# Patient Record
Sex: Male | Born: 1995
Health system: Southern US, Community
[De-identification: ages and names within clinical notes are randomized; demographics above are authoritative.]

## PROBLEM LIST (undated history)

## (undated) DIAGNOSIS — G4730 Sleep apnea, unspecified: Secondary | ICD-10-CM

## (undated) DIAGNOSIS — R7303 Prediabetes: Secondary | ICD-10-CM

## (undated) DIAGNOSIS — E669 Obesity, unspecified: Secondary | ICD-10-CM

## (undated) DIAGNOSIS — J45909 Unspecified asthma, uncomplicated: Secondary | ICD-10-CM

## (undated) DIAGNOSIS — B59 Pneumocystosis: Secondary | ICD-10-CM

## (undated) DIAGNOSIS — B20 Human immunodeficiency virus [HIV] disease: Secondary | ICD-10-CM

## (undated) HISTORY — PX: APPENDECTOMY: SHX54

## (undated) HISTORY — PX: ADENOIDECTOMY: SUR15

## (undated) HISTORY — PX: TONSILLECTOMY: SUR1361

## (undated) HISTORY — DX: Prediabetes: R73.03

## (undated) HISTORY — DX: Pneumocystosis: B59

## (undated) HISTORY — DX: Human immunodeficiency virus (HIV) disease: B20

## (undated) HISTORY — PX: TYMPANOPLASTY: SHX33

## (undated) HISTORY — DX: Sleep apnea, unspecified: G47.30

## (undated) HISTORY — DX: Obesity, unspecified: E66.9

---
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
## 2015-07-03 ENCOUNTER — Emergency Department (HOSPITAL_COMMUNITY)
Admission: EM | Admit: 2015-07-03 | Discharge: 2015-07-03 | Disposition: A | Discharge status: Home / Self Care | Payer: 59 | Attending: Emergency Medicine | Admitting: Emergency Medicine

## 2015-07-03 ENCOUNTER — Encounter (HOSPITAL_COMMUNITY): Payer: Self-pay | Admitting: Emergency Medicine

## 2015-07-03 DIAGNOSIS — J45909 Unspecified asthma, uncomplicated: Secondary | ICD-10-CM | POA: Diagnosis not present

## 2015-07-03 DIAGNOSIS — K610 Anal abscess: Secondary | ICD-10-CM | POA: Diagnosis present

## 2015-07-03 DIAGNOSIS — Z79899 Other long term (current) drug therapy: Secondary | ICD-10-CM | POA: Diagnosis not present

## 2015-07-03 MED ORDER — LIDOCAINE-EPINEPHRINE (PF) 2 %-1:200000 IJ SOLN
20.0000 mL | Freq: Once | INTRAMUSCULAR | Status: AC
  Administered 2015-07-03: 20 mL
  Filled 2015-07-03: qty 20

## 2015-07-03 MED ORDER — SULFAMETHOXAZOLE-TRIMETHOPRIM 800-160 MG PO TABS: 1.0000 | ORAL_TABLET | Freq: Two times a day (BID) | ORAL | Status: AC

## 2015-07-03 NOTE — ED Provider Notes (Signed)
CSN: [id number]     Arrival date & time 07/03/15  0825 History   First MD Initiated Contact with Patient 07/03/15 0830     Chief Complaint  Patient presents with  . Abscess     HPI Patient presents to the emergency department complaining of swelling near his rectum.  He does have intercourse with men.  He's never had an abscess before.  He has had some drainage.  No fevers or chills.  He reports it hurts to have bowel movement and hurts to sit.  No other complaints.  His pain is moderate in severity   Past Medical History  Diagnosis Date  . Asthma    Past Surgical History  Procedure Laterality Date  . Tonsillectomy    . Appendectomy    . Tympanoplasty     Family History  Problem Relation Age of Onset  . Cancer Neg Hx   . Diabetes Neg Hx   . Heart failure Neg Hx    Social History  Substance Use Topics  . Smoking status: Never Smoker   . Smokeless tobacco: None  . Alcohol Use: No    Review of Systems  All other systems reviewed and are negative.     Allergies  Review of patient's allergies indicates no known allergies.  Home Medications   Prior to Admission medications   Medication Sig Start Date End Date Taking? Authorizing Provider  albuterol (PROVENTIL HFA;VENTOLIN HFA) 108 (90 BASE) MCG/ACT inhaler Inhale 2 puffs into the lungs every 4 (four) hours as needed for wheezing or shortness of breath. For shortness of breath   Yes Historical Provider, MD  bismuth subsalicylate (PEPTO BISMOL) 262 MG/15ML suspension Take 30 mLs by mouth every 6 (six) hours as needed. For upset stomach   Yes Historical Provider, MD  ibuprofen (ADVIL,MOTRIN) 200 MG tablet Take 400 mg by mouth every 6 (six) hours as needed for moderate pain.   Yes Historical Provider, MD  montelukast (SINGULAIR) 10 MG tablet Take 10 mg by mouth daily.   Yes Historical Provider, MD  sulfamethoxazole-trimethoprim (BACTRIM DS,SEPTRA DS) 800-160 MG tablet Take 1 tablet by mouth 2 (two) times daily. 07/03/15  07/10/15  Azalia Bilis, MD   BP 141/76 mmHg  Pulse 85  Temp(Src) 99 F (37.2 C) (Oral)  Resp 16  SpO2 95% Physical Exam  Constitutional: He is oriented to person, place, and time. He appears well-developed and well-nourished.  HENT:  Head: Normocephalic.  Eyes: EOM are normal.  Neck: Normal range of motion.  Pulmonary/Chest: Effort normal.  Abdominal: He exhibits no distension.  Genitourinary:  Perianal abscess with drainage.  Musculoskeletal: Normal range of motion.  Neurological: He is alert and oriented to person, place, and time.  Psychiatric: He has a normal mood and affect.  Nursing note and vitals reviewed.   ED Course  Procedures (including critical care time)  INCISION AND DRAINAGE Performed by: Lyanne Co Consent: Verbal consent obtained. Risks and benefits: risks, benefits and alternatives were discussed Time out performed prior to procedure Type: abscess Body area: Perianal Anesthesia: local infiltration Incision was made with a scalpel. Local anesthetic: lidocaine 2 % with epinephrine Anesthetic total: 4 ml Complexity: complex Blunt dissection to break up loculations Drainage: purulent Drainage amount: Moderate  Packing material: None  Patient tolerance: Patient tolerated the procedure well with no immediate complications.     Labs Review Labs Reviewed - No data to display  Imaging Review No results found. I have personally reviewed and evaluated these images and lab results  as part of my medical decision-making.   EKG Interpretation None      MDM   Final diagnoses:  Perianal abscess    Perianal abscess.  Drained at the bedside.  Moderate amount of pus obtained.  Patient feels much better.  Recommend antibiotics and warm water soaks.  He understands to return to the ER for new or worsening symptoms.     Azalia BilisKevin , MD 07/03/15 1011

## 2015-07-03 NOTE — Discharge Instructions (Signed)
Perianal Abscess An abscess is an infected area that contains a collection of pus and debris. A perianal abscess is one that occurs in the perineal area, which is the area between the anus and the scrotum in males and between the anus and the vagina in females. Perianal abscesses can vary in size. Without treatment, a perianal abscess can become larger and cause other problems. CAUSES  Glands in the perineal area can become plugged up with debris. When this happens, an abscess may form.  SIGNS AND SYMPTOMS  The most common symptoms of a perianal abscess are:  Swelling and redness in the area of the abscess. The redness may go beyond the abscess and appear as a red streak on the skin.  Pain in the area of the abscess. Other possible symptoms include:   A visible lump or a lump that can be felt when touching the area and is usually painful.  Bleeding or pus-like discharge from the area.  Fever.  General weakness. DIAGNOSIS  Your health care provider will take a medical history and examine the area. This may involve examining the rectal area with a gloved hand (digital rectal exam). For women, it may require a careful vaginal exam. Sometimes, the health care provider needs to look into the rectum using a probe or scope. TREATMENT  Treatment often requires making a cut (incision) in the abscess to drain the pus. This can sometimes be done in your health care provider's office or an emergency department after giving you medicine to numb the area (local anesthetic). For larger or deeper abscesses, surgery may be required to drain the abscess. Antibiotic medicines are sometimes given if there is infection of the surrounding tissue (cellulitis). In some cases, gauze is packed into the abscess to continue draining the area. Frequent sitz baths may be recommended to help the wound heal and to reduce the chance of the abscess coming back. HOME CARE INSTRUCTIONS   Only take over-the-counter or  prescription medicines for pain, fever, or discomfort as directed by your health care provider.  Take antibiotic medicine as directed. Make sure you finish it even if you start to feel better.  If gauze is used in the abscess, follow your health care provider's instructions for removing or changing the gauze. It can usually be removed in 2-3 days.  If one or more drains have been placed in the abscess cavity, be careful not to pull at them. Your health care provider will tell you how long they need to remain in place.  Take warm sitz baths 3-4 times a day and after bowel movements. This will help reduce pain and swelling.  Keep the skin around the abscess clean and dry. Avoid cleaning the area too much.  Avoid scratching the abscess area.  Avoid using colored or perfumed toilet papers. SEEK MEDICAL CARE IF:   You have trouble having a bowel movement or passing urine.  Your pain or swelling in the affected area does not seem to be improving.  The gauze packing or the drains come out before the planned time. SEEK IMMEDIATE MEDICAL CARE IF:   You have problems moving or using your legs.  You have severe or increasing pain.  Your swelling in the affected area suddenly gets worse.  You have a large increase in bleeding or passing of pus.  You have chills or a fever. MAKE SURE YOU:   Understand these instructions.  Will watch your condition.  Will get help right away if you are   not doing well or get worse.   This information is not intended to replace advice given to you by your health care provider. Make sure you discuss any questions you have with your health care provider.   Document Released: 09/03/2006 Document Revised: 05/18/2013 Document Reviewed: 03/09/2013 Elsevier Interactive Patient Education 2016 [employer]  

## 2015-07-03 NOTE — ED Notes (Signed)
Per patient states he noticed bump on right buttock about a week ago-has gotten worse-some drainage, increased pain

## 2015-12-02 ENCOUNTER — Emergency Department (HOSPITAL_COMMUNITY)
Admission: EM | Admit: 2015-12-02 | Discharge: 2015-12-02 | Disposition: A | Discharge status: Home / Self Care | Payer: 59 | Attending: Emergency Medicine | Admitting: Emergency Medicine

## 2015-12-02 ENCOUNTER — Emergency Department (HOSPITAL_COMMUNITY): Payer: 59

## 2015-12-02 DIAGNOSIS — Z7951 Long term (current) use of inhaled steroids: Secondary | ICD-10-CM | POA: Diagnosis not present

## 2015-12-02 DIAGNOSIS — J45909 Unspecified asthma, uncomplicated: Secondary | ICD-10-CM | POA: Insufficient documentation

## 2015-12-02 DIAGNOSIS — Z79899 Other long term (current) drug therapy: Secondary | ICD-10-CM | POA: Diagnosis not present

## 2015-12-02 DIAGNOSIS — Z791 Long term (current) use of non-steroidal anti-inflammatories (NSAID): Secondary | ICD-10-CM | POA: Insufficient documentation

## 2015-12-02 DIAGNOSIS — R079 Chest pain, unspecified: Secondary | ICD-10-CM | POA: Diagnosis not present

## 2015-12-02 DIAGNOSIS — M549 Dorsalgia, unspecified: Secondary | ICD-10-CM | POA: Diagnosis not present

## 2015-12-02 DIAGNOSIS — Y92411 Interstate highway as the place of occurrence of the external cause: Secondary | ICD-10-CM | POA: Diagnosis not present

## 2015-12-02 DIAGNOSIS — Y999 Unspecified external cause status: Secondary | ICD-10-CM | POA: Insufficient documentation

## 2015-12-02 DIAGNOSIS — Y939 Activity, unspecified: Secondary | ICD-10-CM | POA: Insufficient documentation

## 2015-12-02 MED ORDER — METHOCARBAMOL 500 MG PO TABS: 500.0000 mg | ORAL_TABLET | Freq: Two times a day (BID) | ORAL | Status: DC

## 2015-12-02 MED ORDER — IBUPROFEN 800 MG PO TABS: 800.0000 mg | ORAL_TABLET | Freq: Three times a day (TID) | ORAL | Status: DC
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
## 2015-12-02 NOTE — Discharge Instructions (Signed)

## 2015-12-02 NOTE — ED Notes (Signed)
PT DISCHARGED. INSTRUCTIONS AND PRESCRIPTIONS GIVEN. AAOX3. PT IN NO APPARENT DISTRESS. THE OPPORTUNITY TO ASK QUESTIONS WAS PROVIDED. 
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
## 2015-12-04 ENCOUNTER — Encounter (HOSPITAL_BASED_OUTPATIENT_CLINIC_OR_DEPARTMENT_OTHER): Payer: Self-pay | Admitting: Emergency Medicine
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
## 2019-03-21 DIAGNOSIS — U071 COVID-19: Secondary | ICD-10-CM | POA: Diagnosis not present

## 2019-03-21 DIAGNOSIS — Z03818 Encounter for observation for suspected exposure to other biological agents ruled out: Secondary | ICD-10-CM | POA: Diagnosis not present

## 2019-10-31 ENCOUNTER — Ambulatory Visit: Payer: Self-pay | Admitting: Family Medicine

## 2020-01-10 ENCOUNTER — Ambulatory Visit (HOSPITAL_COMMUNITY)
Admission: EM | Admit: 2020-01-10 | Discharge: 2020-01-10 | Disposition: A | Discharge status: Home / Self Care | Payer: BC Managed Care – PPO | Attending: Family Medicine | Admitting: Family Medicine

## 2020-01-10 ENCOUNTER — Encounter (HOSPITAL_COMMUNITY): Payer: Self-pay

## 2020-01-10 ENCOUNTER — Other Ambulatory Visit: Payer: Self-pay

## 2020-01-10 DIAGNOSIS — K5900 Constipation, unspecified: Secondary | ICD-10-CM | POA: Diagnosis not present

## 2020-01-10 DIAGNOSIS — Z20822 Contact with and (suspected) exposure to covid-19: Secondary | ICD-10-CM | POA: Diagnosis not present

## 2020-01-10 DIAGNOSIS — R1030 Lower abdominal pain, unspecified: Secondary | ICD-10-CM

## 2020-01-10 LAB — LIPASE, BLOOD: Lipase: 16 U/L (ref 11–51)

## 2020-01-10 LAB — CBC WITH DIFFERENTIAL/PLATELET
Abs Immature Granulocytes: 0.04 10*3/uL (ref 0.00–0.07)
Basophils Absolute: 0 10*3/uL (ref 0.0–0.1)
Basophils Relative: 0 %
Eosinophils Absolute: 0 10*3/uL (ref 0.0–0.5)
Eosinophils Relative: 0 %
HCT: 45.5 % (ref 39.0–52.0)
Hemoglobin: 15.6 g/dL (ref 13.0–17.0)
Immature Granulocytes: 1 %
Lymphocytes Relative: 25 %
Lymphs Abs: 2.1 10*3/uL (ref 0.7–4.0)
MCH: 30.6 pg (ref 26.0–34.0)
MCHC: 34.3 g/dL (ref 30.0–36.0)
MCV: 89.2 fL (ref 80.0–100.0)
Monocytes Absolute: 1 10*3/uL (ref 0.1–1.0)
Monocytes Relative: 12 %
Neutro Abs: 5.2 10*3/uL (ref 1.7–7.7)
Neutrophils Relative %: 62 %
Platelets: 301 10*3/uL (ref 150–400)
RBC: 5.1 MIL/uL (ref 4.22–5.81)
RDW: 11.4 % — ABNORMAL LOW (ref 11.5–15.5)
WBC: 8.3 10*3/uL (ref 4.0–10.5)
nRBC: 0 % (ref 0.0–0.2)

## 2020-01-10 LAB — COMPREHENSIVE METABOLIC PANEL
ALT: 23 U/L (ref 0–44)
AST: 22 U/L (ref 15–41)
Albumin: 3.9 g/dL (ref 3.5–5.0)
Alkaline Phosphatase: 61 U/L (ref 38–126)
Anion gap: 14 (ref 5–15)
BUN: 7 mg/dL (ref 6–20)
CO2: 26 mmol/L (ref 22–32)
Calcium: 9.3 mg/dL (ref 8.9–10.3)
Chloride: 100 mmol/L (ref 98–111)
Creatinine, Ser: 0.75 mg/dL (ref 0.61–1.24)
GFR calc Af Amer: >60 mL/min (ref >60)
GFR calc non Af Amer: >60 mL/min (ref >60)
Glucose, Bld: 114 mg/dL — ABNORMAL HIGH (ref 70–99)
Potassium: 3.8 mmol/L (ref 3.5–5.1)
Sodium: 140 mmol/L (ref 135–145)
Total Bilirubin: 0.5 mg/dL (ref 0.3–1.2)
Total Protein: 7.9 g/dL (ref 6.5–8.1)

## 2020-01-10 NOTE — ED Triage Notes (Signed)
Pt c/o lower abdom pain (LLQ more than RLQ) since last Wednesday that pt describes as pressure and feeling constipated. Pt states he had video visit for same c/o and used laxative (Miralax), Fleets supp and ducolax pills without any bowel movement.  Also c/o HA and mild cough for past two days. States feels a "release of pressure" from lower abdomen when he urinates and urinary frequency, urgency. Also reports lower back pain and lack of appetite for 5 days.  Denies vomiting, but feels his stomach is "uneasy". Denies fever, sore throat, congestion, burning on urination.   Denies tylenol/ibuprofen.

## 2020-01-10 NOTE — Discharge Instructions (Signed)
You have been seen today for abdominal pain. Your evaluation was not suggestive of any emergent condition requiring medical intervention at this time. However, some abdominal problems make take more time to appear. Therefore, it is very important for you to pay attention to any new symptoms or worsening of your current condition.  Please return here or to the Emergency Department immediately should you begin to feel worse in any way or have any of the following symptoms: increasing or different abdominal pain, persistent vomiting, inability to drink fluids, fevers, or shaking chills.   You may try over the counter magnesium citrate to help with constipation.

## 2020-01-10 NOTE — ED Notes (Signed)
POCT Urinalysis Dipstick results did not cross over into Epic System.  Paper copy given to Dr. Tracie Harrier by B. Bing Plume, EMT

## 2020-01-11 LAB — SARS CORONAVIRUS 2 (TAT 6-24 HRS): SARS Coronavirus 2: NEGATIVE

## 2020-01-11 LAB — POCT URINALYSIS DIP (DEVICE)
Glucose, UA: NEGATIVE mg/dL
Hgb urine dipstick: NEGATIVE
Ketones, ur: 15 mg/dL — AB
Leukocytes,Ua: NEGATIVE
Nitrite: NEGATIVE
Protein, ur: 100 mg/dL — AB
Specific Gravity, Urine: >=1.03 (ref 1.005–1.030)
Urobilinogen, UA: 2 mg/dL — ABNORMAL HIGH (ref 0.0–1.0)
pH: 6 (ref 5.0–8.0)

## 2020-01-11 NOTE — ED Provider Notes (Signed)
[facility]   [id number] 01/10/20 Arrival Time: 1541  ASSESSMENT & PLAN:  1. Lower abdominal pain   2. Constipation, unspecified constipation type     Benign abdominal exam. No indications for urgent abdominal/pelvic imaging at this time. Discussed. COVID testing sent. See AVS for d/c instructions.  Results for orders placed or performed during the hospital encounter of 01/10/20  CBC with Differential  Result Value Ref Range   WBC 8.3 4.0 - 10.5 K/uL   RBC 5.10 4.22 - 5.81 MIL/uL   Hemoglobin 15.6 13.0 - 17.0 g/dL   HCT 39.0 30.0 - 92.3 %   MCV 89.2 80.0 - 100.0 fL   MCH 30.6 26.0 - 34.0 pg   MCHC 34.3 30.0 - 36.0 g/dL   RDW 30.0 (L) 76.2 - 26.3 %   Platelets 301 150 - 400 K/uL   nRBC 0.0 0.0 - 0.2 %   Neutrophils Relative % 62 %   Neutro Abs 5.2 1.7 - 7.7 K/uL   Lymphocytes Relative 25 %   Lymphs Abs 2.1 0.7 - 4.0 K/uL   Monocytes Relative 12 %   Monocytes Absolute 1.0 0.1 - 1.0 K/uL   Eosinophils Relative 0 %   Eosinophils Absolute 0.0 0.0 - 0.5 K/uL   Basophils Relative 0 %   Basophils Absolute 0.0 0.0 - 0.1 K/uL   Immature Granulocytes 1 %   Abs Immature Granulocytes 0.04 0.00 - 0.07 K/uL  Comprehensive metabolic panel  Result Value Ref Range   Sodium 140 135 - 145 mmol/L   Potassium 3.8 3.5 - 5.1 mmol/L   Chloride 100 98 - 111 mmol/L   CO2 26 22 - 32 mmol/L   Glucose, Bld 114 (H) 70 - 99 mg/dL   BUN 7 6 - 20 mg/dL   Creatinine, Ser 3.35 0.61 - 1.24 mg/dL   Calcium 9.3 8.9 - 45.6 mg/dL   Total Protein 7.9 6.5 - 8.1 g/dL   Albumin 3.9 3.5 - 5.0 g/dL   AST 22 15 - 41 U/L   ALT 23 0 - 44 U/L   Alkaline Phosphatase 61 38 - 126 U/L   Total Bilirubin 0.5 0.3 - 1.2 mg/dL   GFR calc non Af Amer >60 >60 mL/min   GFR calc Af Amer >60 >60 mL/min   Anion gap 14 5 - 15  Lipase, blood  Result Value Ref Range   Lipase 16 11 - 51 U/L       Discharge Instructions     You have been seen today for abdominal pain. Your evaluation was not suggestive of any  emergent condition requiring medical intervention at this time. However, some abdominal problems make take more time to appear. Therefore, it is very important for you to pay attention to any new symptoms or worsening of your current condition.  Please return here or to the Emergency Department immediately should you begin to feel worse in any way or have any of the following symptoms: increasing or different abdominal pain, persistent vomiting, inability to drink fluids, fevers, or shaking chills.   You may try over the counter magnesium citrate to help with constipation.     Reviewed expectations re: course of current medical issues. Questions answered. Outlined signs and symptoms indicating need for more acute intervention. Patient verbalized understanding. After Visit Summary given.   SUBJECTIVE: History from: patient. Marvin Miller is a 24 y.o. male who presents with complaint of intermittent lower abdominal discomfort. Onset gradual, first noted within the past week. Discomfort  described as a "full feeling"; without radiation; does not wake him at night. Reports normal flatus. Symptoms are unchanged since beginning. Fever: absent. Aggravating factors: have not been identified. Alleviating factors: passing gas. Associated symptoms: none reported. He denies arthralgias, dysuria, headache, myalgias and sweats. Appetite: normal. PO intake: normal. Ambulatory without assistance. Urinary symptoms: none. Bowel movements: are less frequent than before; last bowel movement 2-3 d ago and without blood; usually has BM x2 daily. History of similar: yes; dx with constipation. OTC treatment: Miralax without relief.   Past Surgical History:  Procedure Laterality Date  . APPENDECTOMY    . TONSILLECTOMY    . TYMPANOPLASTY       OBJECTIVE:  Vitals:   01/10/20 1647  BP: (!) 142/80  Pulse: 93  Resp: 20  Temp: 99.1 F (37.3 C)  TempSrc: Oral  SpO2: 98%    General appearance: alert,  oriented, no acute distress HEENT: [place]; AT; oropharynx moist Lungs: unlabored respirations Abdomen: soft; without distention; mild  and poorly localized tenderness to palpation over lower abdomen; normal bowel sounds; without masses or organomegaly; without guarding or rebound tenderness Back: without reported CVA tenderness; FROM at waist Extremities: without LE edema; symmetrical; without gross deformities Skin: warm and dry Neurologic: normal gait Psychological: alert and cooperative; normal mood and affect    No Known Allergies                                             Past Medical History:  Diagnosis Date  . Asthma     Social History   Socioeconomic History  . Marital status: Single    Spouse name: Not on file  . Number of children: Not on file  . Years of education: Not on file  . Highest education level: Not on file  Occupational History  . Not on file  Tobacco Use  . Smoking status: Never Smoker  . Smokeless tobacco: Never Used  Substance and Sexual Activity  . Alcohol use: No  . Drug use: No  . Sexual activity: Not on file  Other Topics Concern  . Not on file  Social History Narrative  . Not on file   Social Determinants of Health   Financial Resource Strain:   . Difficulty of Paying Living Expenses:   Food Insecurity:   . Worried About Programme researcher, broadcasting/film/video in the Last Year:   . Barista in the Last Year:   Transportation Needs:   . Freight forwarder (Medical):   Marland Kitchen Lack of Transportation (Non-Medical):   Physical Activity:   . Days of Exercise per Week:   . Minutes of Exercise per Session:   Stress:   . Feeling of Stress :   Social Connections:   . Frequency of Communication with Friends and Family:   . Frequency of Social Gatherings with Friends and Family:   . Attends Religious Services:   . Active Member of Clubs or Organizations:   . Attends Banker Meetings:   Marland Kitchen Marital Status:   Intimate Partner Violence:   . Fear  of Current or Ex-Partner:   . Emotionally Abused:   Marland Kitchen Physically Abused:   . Sexually Abused:     Family History  Problem Relation Age of Onset  . Hypertension Mother   . Asthma Mother   . Heart failure Father   . Diabetes  Father   . Cancer Neg Hx     Vanessa Kick, MD 01/11/20 1003

## 2020-01-24 DIAGNOSIS — Z1331 Encounter for screening for depression: Secondary | ICD-10-CM | POA: Diagnosis not present

## 2020-01-24 DIAGNOSIS — R030 Elevated blood-pressure reading, without diagnosis of hypertension: Secondary | ICD-10-CM | POA: Diagnosis not present

## 2020-01-24 DIAGNOSIS — R739 Hyperglycemia, unspecified: Secondary | ICD-10-CM | POA: Diagnosis not present

## 2020-01-24 DIAGNOSIS — R21 Rash and other nonspecific skin eruption: Secondary | ICD-10-CM | POA: Diagnosis not present

## 2020-01-24 DIAGNOSIS — G4452 New daily persistent headache (NDPH): Secondary | ICD-10-CM | POA: Diagnosis not present

## 2020-02-07 DIAGNOSIS — Z114 Encounter for screening for human immunodeficiency virus [HIV]: Secondary | ICD-10-CM | POA: Diagnosis not present

## 2020-02-07 DIAGNOSIS — Z113 Encounter for screening for infections with a predominantly sexual mode of transmission: Secondary | ICD-10-CM | POA: Diagnosis not present

## 2020-02-07 DIAGNOSIS — A5149 Other secondary syphilitic conditions: Secondary | ICD-10-CM | POA: Diagnosis not present

## 2023-01-17 ENCOUNTER — Ambulatory Visit (HOSPITAL_COMMUNITY): Payer: Self-pay

## 2023-01-19 ENCOUNTER — Ambulatory Visit (HOSPITAL_COMMUNITY)
Admission: RE | Admit: 2023-01-19 | Discharge: 2023-01-19 | Disposition: A | Discharge status: Home / Self Care | Payer: No Typology Code available for payment source | Source: Ambulatory Visit | Attending: Family Medicine | Admitting: Family Medicine

## 2023-01-19 ENCOUNTER — Other Ambulatory Visit: Payer: Self-pay

## 2023-01-19 ENCOUNTER — Ambulatory Visit (INDEPENDENT_AMBULATORY_CARE_PROVIDER_SITE_OTHER): Payer: No Typology Code available for payment source

## 2023-01-19 ENCOUNTER — Encounter (HOSPITAL_COMMUNITY): Payer: Self-pay

## 2023-01-19 VITALS — BP 134/93 | HR 89 | Temp 98.2°F | Resp 19

## 2023-01-19 DIAGNOSIS — K5900 Constipation, unspecified: Secondary | ICD-10-CM | POA: Diagnosis present

## 2023-01-19 LAB — COMPREHENSIVE METABOLIC PANEL
ALT: 16 U/L (ref 0–44)
AST: 18 U/L (ref 15–41)
Albumin: 3.8 g/dL (ref 3.5–5.0)
Alkaline Phosphatase: 31 U/L — ABNORMAL LOW (ref 38–126)
Anion gap: 12 (ref 5–15)
BUN: 5 mg/dL — ABNORMAL LOW (ref 6–20)
CO2: 25 mmol/L (ref 22–32)
Calcium: 8.9 mg/dL (ref 8.9–10.3)
Chloride: 100 mmol/L (ref 98–111)
Creatinine, Ser: 0.7 mg/dL (ref 0.61–1.24)
GFR, Estimated: >60 mL/min (ref >60)
Glucose, Bld: 90 mg/dL (ref 70–99)
Potassium: 3.9 mmol/L (ref 3.5–5.1)
Sodium: 137 mmol/L (ref 135–145)
Total Bilirubin: 0.6 mg/dL (ref 0.3–1.2)
Total Protein: 6.7 g/dL (ref 6.5–8.1)

## 2023-01-19 LAB — CBC
HCT: 40.2 % (ref 39.0–52.0)
Hemoglobin: 14 g/dL (ref 13.0–17.0)
MCH: 30.6 pg (ref 26.0–34.0)
MCHC: 34.8 g/dL (ref 30.0–36.0)
MCV: 87.8 fL (ref 80.0–100.0)
Platelets: 203 10*3/uL (ref 150–400)
RBC: 4.58 MIL/uL (ref 4.22–5.81)
RDW: 12.8 % (ref 11.5–15.5)
WBC: 5.5 10*3/uL (ref 4.0–10.5)
nRBC: 0 % (ref 0.0–0.2)

## 2023-01-19 NOTE — ED Triage Notes (Signed)
Pt reports feeling constipated for 2 weeks. Pt reports feeling bloated and pressure at rectum. Pt also had rectal bleeding last night.

## 2023-01-19 NOTE — Discharge Instructions (Addendum)
Your x-ray did show a good bit of stool in your colon.  Get a bottle of magnesium citrate over-the-counter and take it according to package instructions  Also get MiraLAX and start taking it according to bottle instructions, 1 capful in 6 to 8 ounces of liquid once daily.  We have drawn a blood count and a CMP today.  Staff will notify you if anything is significantly abnormal.  If your pain worsens or you are not able to have a bowel movement despite the above medications, and please consider being seen on an urgent basis in the emergency room.  You can use the QR code/website at the back of the summary paperwork to schedule yourself a new patient appointment with primary care

## 2023-01-19 NOTE — ED Provider Notes (Signed)
[facility]    CSN: [id number] Arrival date & time: 01/19/23  1447      History   Chief Complaint Chief Complaint  Patient presents with   Constipation    Entered by patient    HPI Marvin Miller is a 27 y.o. male.    Constipation  Here for constipation has been going on for 2 weeks.  He saw little bit of blood when it first started and then this morning when he had a small amount of bowel movement he had blood pour into the toilet bowl and has had pain in his rectum.  No fever and no nausea or vomiting.  He has maybe had a little congestion in the last couple of weeks with little cough.   He has had constipation in the past, but not to this degree; he has been trying to take a stool softener  Past Medical History:  Diagnosis Date   Asthma     There are no problems to display for this patient.   Past Surgical History:  Procedure Laterality Date   APPENDECTOMY     TONSILLECTOMY     TYMPANOPLASTY         Home Medications    Prior to Admission medications   Not on File    Family History Family History  Problem Relation Age of Onset   Hypertension Mother    Asthma Mother    Heart failure Father    Diabetes Father    Cancer Neg Hx     Social History Social History   Tobacco Use   Smoking status: Never   Smokeless tobacco: Never  Vaping Use   Vaping Use: Never used  Substance Use Topics   Alcohol use: No   Drug use: No     Allergies   Patient has no known allergies.   Review of Systems Review of Systems  Gastrointestinal:  Positive for constipation.     Physical Exam Triage Vital Signs ED Triage Vitals  Enc Vitals Group     BP 01/19/23 1505 (!) 134/93     Pulse Rate 01/19/23 1505 89     Resp 01/19/23 1505 19     Temp 01/19/23 1505 98.2 F (36.8 C)     Temp src --      SpO2 01/19/23 1505 97 %     Weight --      Height --      Head Circumference --      Peak Flow --      Pain Score 01/19/23 1503 8     Pain Loc  --      Pain Edu? --      Excl. in GC? --    No data found.  Updated Vital Signs BP (!) 134/93   Pulse 89   Temp 98.2 F (36.8 C)   Resp 19   SpO2 97%   Visual Acuity Right Eye Distance:   Left Eye Distance:   Bilateral Distance:    Right Eye Near:   Left Eye Near:    Bilateral Near:     Physical Exam Vitals reviewed.  Constitutional:      General: He is not in acute distress.    Appearance: He is not ill-appearing, toxic-appearing or diaphoretic.  HENT:     Mouth/Throat:     Mouth: Mucous membranes are moist.     Pharynx: No oropharyngeal exudate or posterior oropharyngeal erythema.  Eyes:     Extraocular Movements: Extraocular movements  intact.     Conjunctiva/sclera: Conjunctivae normal.     Pupils: Pupils are equal, round, and reactive to light.  Cardiovascular:     Rate and Rhythm: Normal rate and regular rhythm.     Heart sounds: No murmur heard. Pulmonary:     Effort: Pulmonary effort is normal.     Breath sounds: Normal breath sounds.  Abdominal:     General: Bowel sounds are normal. There is no distension.     Palpations: Abdomen is soft. There is no mass.     Tenderness: There is no abdominal tenderness. There is no guarding.  Genitourinary:    Comments: Inspection of the rectum does not show any external hemorrhoids.  He is very tender around his rectum, but I do not palpate any induration or fluctuance or masses.  An attempt is made to do a digital rectal exam, but he was so tender that I could not palpate further than the rectal verge. Musculoskeletal:     Cervical back: Neck supple.  Skin:    Capillary Refill: Capillary refill takes less than 2 seconds.     Coloration: Skin is not jaundiced or pale.  Neurological:     General: No focal deficit present.     Mental Status: He is alert and oriented to person, place, and time.  Psychiatric:        Behavior: Behavior normal.      UC Treatments / Results  Labs (all labs ordered are listed, but  only abnormal results are displayed) Labs Reviewed  COMPREHENSIVE METABOLIC PANEL  CBC    EKG   Radiology No results found.  Procedures Procedures (including critical care time)  Medications Ordered in UC Medications - No data to display  Initial Impression / Assessment and Plan / UC Course  I have reviewed the triage vital signs and the nursing notes.  Pertinent labs & imaging results that were available during my care of the patient were reviewed by me and considered in my medical decision making (see chart for details).        There is a moderate stool burden on the KUB done today.  I am recommending mag citrate and MiraLAX.  If he worsens anyway I am asking him to proceed to the emergency room.  CBC and CMP are drawn today and we will let him know if there is any problems on the lab work.  I do think he most likely has at least a small rectal fissure due to a large caliber stool, considering his history Final Clinical Impressions(s) / UC Diagnoses   Final diagnoses:  Constipation, unspecified constipation type     Discharge Instructions      Your x-ray did show a good bit of stool in your colon.  Get a bottle of magnesium citrate over-the-counter and take it according to package instructions  Also get MiraLAX and start taking it according to bottle instructions, 1 capful in 6 to 8 ounces of liquid once daily.  We have drawn a blood count and a CMP today.  Staff will notify you if anything is significantly abnormal.  If your pain worsens or you are not able to have a bowel movement despite the above medications, and please consider being seen on an urgent basis in the emergency room.  You can use the QR code/website at the back of the summary paperwork to schedule yourself a new patient appointment with primary care      ED Prescriptions   None  I have reviewed the PDMP during this encounter.   Zenia Resides, MD 01/19/23 [phone]

## 2023-03-22 ENCOUNTER — Encounter (HOSPITAL_COMMUNITY): Payer: Self-pay

## 2023-03-22 ENCOUNTER — Emergency Department (HOSPITAL_COMMUNITY): Payer: No Typology Code available for payment source

## 2023-03-22 ENCOUNTER — Other Ambulatory Visit: Payer: Self-pay

## 2023-03-22 ENCOUNTER — Inpatient Hospital Stay (HOSPITAL_COMMUNITY)
Admission: EM | Admit: 2023-03-22 | Discharge: 2023-03-27 | DRG: 975 | Disposition: A | Discharge status: Home / Self Care | Payer: No Typology Code available for payment source | Attending: Internal Medicine | Admitting: Internal Medicine

## 2023-03-22 DIAGNOSIS — E669 Obesity, unspecified: Secondary | ICD-10-CM | POA: Diagnosis present

## 2023-03-22 DIAGNOSIS — Z8249 Family history of ischemic heart disease and other diseases of the circulatory system: Secondary | ICD-10-CM

## 2023-03-22 DIAGNOSIS — Z1152 Encounter for screening for COVID-19: Secondary | ICD-10-CM

## 2023-03-22 DIAGNOSIS — Z87891 Personal history of nicotine dependence: Secondary | ICD-10-CM

## 2023-03-22 DIAGNOSIS — D649 Anemia, unspecified: Secondary | ICD-10-CM | POA: Diagnosis present

## 2023-03-22 DIAGNOSIS — Z825 Family history of asthma and other chronic lower respiratory diseases: Secondary | ICD-10-CM

## 2023-03-22 DIAGNOSIS — J45909 Unspecified asthma, uncomplicated: Secondary | ICD-10-CM | POA: Diagnosis present

## 2023-03-22 DIAGNOSIS — J189 Pneumonia, unspecified organism: Secondary | ICD-10-CM | POA: Diagnosis not present

## 2023-03-22 DIAGNOSIS — B59 Pneumocystosis: Secondary | ICD-10-CM | POA: Diagnosis not present

## 2023-03-22 DIAGNOSIS — E871 Hypo-osmolality and hyponatremia: Secondary | ICD-10-CM | POA: Diagnosis present

## 2023-03-22 DIAGNOSIS — R0902 Hypoxemia: Secondary | ICD-10-CM | POA: Diagnosis not present

## 2023-03-22 DIAGNOSIS — B20 Human immunodeficiency virus [HIV] disease: Secondary | ICD-10-CM | POA: Diagnosis present

## 2023-03-22 DIAGNOSIS — Z6833 Body mass index (BMI) 33.0-33.9, adult: Secondary | ICD-10-CM

## 2023-03-22 DIAGNOSIS — Z6841 Body Mass Index (BMI) 40.0 and over, adult: Secondary | ICD-10-CM | POA: Diagnosis present

## 2023-03-22 DIAGNOSIS — E876 Hypokalemia: Secondary | ICD-10-CM | POA: Diagnosis present

## 2023-03-22 DIAGNOSIS — R7402 Elevation of levels of lactic acid dehydrogenase (LDH): Secondary | ICD-10-CM | POA: Diagnosis present

## 2023-03-22 DIAGNOSIS — Z833 Family history of diabetes mellitus: Secondary | ICD-10-CM

## 2023-03-22 LAB — CBC
HCT: 40.3 % (ref 39.0–52.0)
Hemoglobin: 14.1 g/dL (ref 13.0–17.0)
MCH: 29.8 pg (ref 26.0–34.0)
MCHC: 35 g/dL (ref 30.0–36.0)
MCV: 85.2 fL (ref 80.0–100.0)
Platelets: 252 10*3/uL (ref 150–400)
RBC: 4.73 MIL/uL (ref 4.22–5.81)
RDW: 11.6 % (ref 11.5–15.5)
WBC: 5.5 10*3/uL (ref 4.0–10.5)
nRBC: 0 % (ref 0.0–0.2)

## 2023-03-22 LAB — RESPIRATORY PANEL BY PCR

## 2023-03-22 LAB — TROPONIN I (HIGH SENSITIVITY)
Troponin I (High Sensitivity): 22 ng/L — ABNORMAL HIGH (ref <18)
Troponin I (High Sensitivity): 22 ng/L — ABNORMAL HIGH (ref <18)

## 2023-03-22 LAB — PROCALCITONIN: Procalcitonin: <0.1 ng/mL

## 2023-03-22 LAB — BASIC METABOLIC PANEL
Anion gap: 13 (ref 5–15)
BUN: 5 mg/dL — ABNORMAL LOW (ref 6–20)
CO2: 23 mmol/L (ref 22–32)
Calcium: 8.7 mg/dL — ABNORMAL LOW (ref 8.9–10.3)
Chloride: 97 mmol/L — ABNORMAL LOW (ref 98–111)
Creatinine, Ser: 0.73 mg/dL (ref 0.61–1.24)
GFR, Estimated: >60 mL/min (ref >60)
Glucose, Bld: 119 mg/dL — ABNORMAL HIGH (ref 70–99)
Potassium: 3.3 mmol/L — ABNORMAL LOW (ref 3.5–5.1)
Sodium: 133 mmol/L — ABNORMAL LOW (ref 135–145)

## 2023-03-22 LAB — SARS CORONAVIRUS 2 BY RT PCR: SARS Coronavirus 2 by RT PCR: NEGATIVE

## 2023-03-22 LAB — D-DIMER, QUANTITATIVE: D-Dimer, Quant: <0.27 ug/mL-FEU (ref 0.00–0.50)

## 2023-03-22 LAB — I-STAT CG4 LACTIC ACID, ED: Lactic Acid, Venous: 1.6 mmol/L (ref 0.5–1.9)

## 2023-03-22 MED ORDER — SODIUM CHLORIDE 0.9% FLUSH: 3.0000 mL | INTRAVENOUS | Status: DC | PRN

## 2023-03-22 MED ORDER — SODIUM CHLORIDE 0.9% FLUSH
3.0000 mL | Freq: Two times a day (BID) | INTRAVENOUS | Status: DC
  Administered 2023-03-23 – 2023-03-27 (×8): 3 mL via INTRAVENOUS

## 2023-03-22 MED ORDER — ALBUTEROL SULFATE (2.5 MG/3ML) 0.083% IN NEBU
5.0000 mg | INHALATION_SOLUTION | Freq: Once | RESPIRATORY_TRACT | Status: AC
  Administered 2023-03-22: 5 mg via RESPIRATORY_TRACT
  Filled 2023-03-22: qty 6

## 2023-03-22 MED ORDER — ACETAMINOPHEN 325 MG PO TABS: 650.0000 mg | ORAL_TABLET | ORAL | Status: DC | PRN

## 2023-03-22 MED ORDER — SODIUM CHLORIDE 0.9 % IV BOLUS
1000.0000 mL | Freq: Once | INTRAVENOUS | Status: AC
  Administered 2023-03-22: 1000 mL via INTRAVENOUS

## 2023-03-22 MED ORDER — SODIUM CHLORIDE 0.9 % IV SOLN: 2.0000 g | INTRAVENOUS | Status: DC

## 2023-03-22 MED ORDER — ENOXAPARIN SODIUM 40 MG/0.4ML IJ SOSY
40.0000 mg | PREFILLED_SYRINGE | INTRAMUSCULAR | Status: DC
  Administered 2023-03-22 – 2023-03-26 (×5): 40 mg via SUBCUTANEOUS
  Filled 2023-03-22 (×5): qty 0.4

## 2023-03-22 MED ORDER — POTASSIUM CHLORIDE CRYS ER 20 MEQ PO TBCR
20.0000 meq | EXTENDED_RELEASE_TABLET | Freq: Two times a day (BID) | ORAL | Status: AC
  Administered 2023-03-22 – 2023-03-23 (×2): 20 meq via ORAL
  Filled 2023-03-22 (×2): qty 1

## 2023-03-22 MED ORDER — SODIUM CHLORIDE 0.9 % IV SOLN
1.0000 g | Freq: Once | INTRAVENOUS | Status: AC
  Administered 2023-03-22: 1 g via INTRAVENOUS
  Filled 2023-03-22: qty 10

## 2023-03-22 MED ORDER — SODIUM CHLORIDE 0.9 % IV SOLN: 250.0000 mL | INTRAVENOUS | Status: DC | PRN

## 2023-03-22 MED ORDER — IOHEXOL 350 MG/ML SOLN
75.0000 mL | Freq: Once | INTRAVENOUS | Status: AC | PRN
  Administered 2023-03-22: 75 mL via INTRAVENOUS

## 2023-03-22 MED ORDER — AZITHROMYCIN 250 MG PO TABS
500.0000 mg | ORAL_TABLET | Freq: Every day | ORAL | Status: DC
  Administered 2023-03-23 – 2023-03-24 (×2): 500 mg via ORAL
  Filled 2023-03-22 (×2): qty 2

## 2023-03-22 MED ORDER — ACETAMINOPHEN 325 MG PO TABS
650.0000 mg | ORAL_TABLET | Freq: Once | ORAL | Status: AC | PRN
  Administered 2023-03-22: 650 mg via ORAL
  Filled 2023-03-22: qty 2

## 2023-03-22 MED ORDER — SODIUM CHLORIDE 0.9 % IV SOLN
500.0000 mg | Freq: Once | INTRAVENOUS | Status: AC
  Administered 2023-03-22: 500 mg via INTRAVENOUS
  Filled 2023-03-22: qty 5

## 2023-03-22 MED ORDER — LACTATED RINGERS IV BOLUS
1000.0000 mL | Freq: Once | INTRAVENOUS | Status: AC
  Administered 2023-03-22: 1000 mL via INTRAVENOUS

## 2023-03-22 NOTE — H&P (Signed)
History and Physical    Marvin Miller ZOX:[id number] DOB: May 29, 1996 DOA: 03/22/2023  DOS: the patient was seen and examined on 03/22/2023  PCP: Patient, No Pcp Per   Patient coming from: Home  I have personally briefly reviewed patient's old medical records in [facility]  Marvin Miller, a 27 y/o in good health reports several history of non-productive cough, hot flashes, dyspnea. He has had no exposure risks or life-style risks. He presented to [facility] for evaluation.    ED Course: T 99.4  133/90  HR 108  RR 19, O2 sat 99%. Overweigh man in no distress. Lab: K 3.3, lactic acid 1.6, Troponin 22, D-dimer <0.27, CBCD nl. CTA neg for PE, ground glass ASD in both lungs. CXR - low lung volumes, bilateral atelectasis, changes of atypical PNA. In ED patient given Rocephin and Azithromycin. TRH called to admit for further evaluation and tx.   Review of Systems:  Review of Systems  Constitutional:  Positive for malaise/fatigue. Negative for chills, diaphoresis, fever and weight loss.  HENT: Negative.    Eyes: Negative.   Respiratory:  Positive for cough and shortness of breath. Negative for sputum production and wheezing.   Cardiovascular: Negative.   Gastrointestinal: Negative.   Genitourinary: Negative.   Musculoskeletal: Negative.   Skin: Negative.   Neurological: Negative.   Endo/Heme/Allergies: Negative.   Psychiatric/Behavioral: Negative.      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   reports that he has quit smoking. His smoking use included cigarettes. He has never used smokeless tobacco. He reports that he does not drink alcohol and does not use drugs.  No Known Allergies  Family History  Problem Relation Age of Onset   Hypertension Mother    Asthma Mother    Heart failure Father     Diabetes Father    Cancer Neg Hx     Prior to Admission medications   Not on File    Physical Exam: Vitals:   03/22/23 1330 03/22/23 1522 03/22/23 1800 03/22/23 1957  BP: (!) 139/93 (!) 151/98 (!) 133/90 133/80  Pulse: (!) 117 (!) 118 (!) 108   Resp: (!) 34 (!) 21 19 (!) 27  Temp:  99.3 F (37.4 C)    TempSrc:  Oral    SpO2: 97% 99% 99%   Weight:      Height:        Physical Exam Vitals and nursing note reviewed.  Constitutional:      General: He is not in acute distress.    Appearance: He is obese. He is not ill-appearing.  HENT:     Head: Normocephalic and atraumatic.     Mouth/Throat:     Mouth: Mucous membranes are moist.  Eyes:     Extraocular Movements: Extraocular movements intact.     Pupils: Pupils are equal, round, and reactive to light.  Neck:     Thyroid: No thyromegaly.  Cardiovascular:     Rate and Rhythm: Regular rhythm. Tachycardia present.     Heart sounds: No murmur heard. Pulmonary:     Effort: Pulmonary effort is normal. No tachypnea, accessory muscle usage or respiratory distress.     Breath sounds: Normal breath sounds. No decreased breath sounds, wheezing, rhonchi or rales.  Abdominal:     General: Bowel sounds are normal.     Palpations: Abdomen is soft.  Musculoskeletal:        General: Normal range of motion.     Cervical back: Normal range of motion and neck supple.  Skin:    General: Skin is warm and dry.  Neurological:     General: No focal deficit present.     Mental Status: He is alert and oriented to person, place, and time.     Cranial Nerves: No cranial nerve deficit.  Psychiatric:        Mood and Affect: Mood normal.        Behavior: Behavior normal.      Labs on Admission: I have personally reviewed following labs and imaging studies  CBC: Recent Labs  Lab 03/22/23 1133  WBC 5.5  HGB 14.1  HCT 40.3  MCV 85.2  PLT 252   Basic Metabolic Panel: Recent Labs  Lab 03/22/23 1133  NA 133*  K 3.3*  CL 97*  CO2  23  GLUCOSE 119*  BUN 5*  CREATININE 0.73  CALCIUM 8.7*   GFR: Estimated Creatinine Clearance: 159.7 mL/min (by C-G formula based on SCr of 0.73 mg/dL). Liver Function Tests: No results for input(s): "AST", "ALT", "ALKPHOS", "BILITOT", "PROT", "ALBUMIN" in the last 168 hours. No results for input(s): "LIPASE", "AMYLASE" in the last 168 hours. No results for input(s): "AMMONIA" in the last 168 hours. Coagulation Profile: No results for input(s): "INR", "PROTIME" in the last 168 hours. Cardiac Enzymes: No results for input(s): "CKTOTAL", "CKMB", "CKMBINDEX", "TROPONINI" in the last 168 hours. BNP (last 3 results) No results for input(s): "PROBNP" in the last 8760 hours. HbA1C: No results for input(s): "HGBA1C" in the last 72 hours. CBG: No results for input(s): "GLUCAP" in the last 168 hours. Lipid Profile: No results for input(s): "CHOL", "HDL", "LDLCALC", "TRIG", "CHOLHDL", "LDLDIRECT" in the last 72 hours. Thyroid Function Tests: No results for input(s): "TSH", "T4TOTAL", "FREET4", "T3FREE", "THYROIDAB" in the last 72 hours. Anemia Panel: No results for input(s): "VITAMINB12", "FOLATE", "FERRITIN", "TIBC", "IRON", "RETICCTPCT" in the last 72 hours. Urine analysis:    Component Value Date/Time   LABSPEC >=1.030 01/10/2020 1658   PHURINE 6.0 01/10/2020 1658   GLUCOSEU NEGATIVE 01/10/2020 1658   HGBUR NEGATIVE 01/10/2020 1658   BILIRUBINUR SMALL (A) 01/10/2020 1658   KETONESUR 15 (A) 01/10/2020 1658   PROTEINUR 100 (A) 01/10/2020 1658   UROBILINOGEN 2.0 (H) 01/10/2020 1658   NITRITE NEGATIVE 01/10/2020 1658   LEUKOCYTESUR NEGATIVE 01/10/2020 1658    Radiological Exams on Admission: I have personally reviewed images CT Angio Chest PE W/Cm &/Or Wo Cm  Result Date: 03/22/2023 CLINICAL DATA:  Worsening shortness of breath for 2 weeks. Cough. High probability for pulmonary embolism. EXAM: CT ANGIOGRAPHY CHEST WITH CONTRAST TECHNIQUE: Multidetector CT imaging of the chest was  performed using the standard protocol during bolus administration of intravenous contrast. Multiplanar CT image reconstructions and MIPs were obtained to evaluate the vascular anatomy. RADIATION DOSE REDUCTION: This exam was performed according to the departmental dose-optimization program which includes automated exposure control, adjustment of the mA and/or kV according to patient size and/or use of iterative reconstruction technique. CONTRAST:  75mL OMNIPAQUE IOHEXOL 350 MG/ML SOLN COMPARISON:  None Available. FINDINGS: Cardiovascular: Satisfactory opacification of pulmonary arteries noted, and no pulmonary emboli identified. No evidence of thoracic aortic dissection or aneurysm. Mediastinum/Nodes: No masses or pathologically enlarged lymph nodes identified. Lungs/Pleura: Heterogeneous ground-glass opacity and patchy areas of airspace  disease are seen throughout both lungs. Broad differential diagnosis includes atypical infectious and inflammatory etiologies, and inhalational injury. No evidence of pulmonary mass or pleural effusion. Upper abdomen: No acute findings. Musculoskeletal: No suspicious bone lesions identified. Review of the MIP images confirms the above findings. IMPRESSION: No evidence of pulmonary embolism. Heterogeneous ground-glass opacity and patchy areas of airspace disease throughout both lungs. Broad differential diagnosis includes atypical infectious and inflammatory etiologies, and inhalational injury. Electronically Signed   By: Danae Orleans M.D.   On: 03/22/2023 17:05   DG Chest 2 View  Result Date: 03/22/2023 CLINICAL DATA:  27 year old with shortness of breath. EXAM: CHEST - 2 VIEW COMPARISON:  12/02/2015 FINDINGS: Two views of the chest demonstrate low lung volumes. Slightly prominent lung markings may be associated with the low lung volumes. Linear densities in the perihilar regions. No pleural effusions. Heart size is normal. Trachea is midline. Negative for a pneumothorax.  IMPRESSION: Low lung volumes with bilateral atelectasis and prominent lung markings. Cannot exclude atypical infection. Electronically Signed   By: Richarda Overlie M.D.   On: 03/22/2023 12:21    EKG: I have personally reviewed EKG: Sinus tachycardia, no acute changes  Assessment/Plan Principal Problem:   CAP (community acquired pneumonia)    Assessment and Plan: * CAP (community acquired pneumonia) Patient presents for evaluation due to persistent cough that is non-productive and SOB. No exposure risks. Denies documented fevers but has had hot flashes. Patient afebrile, O2 sat 99%, lungs clear on exam. CBCD nl, Cmet ok. CXR with low lung Volumes, Bilateral basilar atelectasis, atypical ASD. CTA chest - negative for PE, ground glass ASD both lungs. Influenza and covid negative.  Rec'd Rocephin and zithromax in ED  Plan Obs admit med-surg  No indication for continued abx  Respiratory panel ordered and pending  Urine Ag Legionela ordered  Procalcitonin ordered.          DVT prophylaxis: Lovenox Code Status: Full Code Family Communication: mother present during interview and exam. Agrees with DX and TX plan  Disposition Plan: home 24-48 hrs  Consults called: none  Admission status: Observation, Med-Surg   Illene Regulus, MD [facility] 03/22/2023, 8:02 PM

## 2023-03-22 NOTE — ED Provider Notes (Signed)
[place] EMERGENCY DEPARTMENT AT [facility] Provider Note   CSN: [id number] Arrival date & time: 03/22/23  1122     History  Chief Complaint  Patient presents with   Cough   Shortness of Breath    Marvin Miller is a 27 y.o. male.  Patient is a 27 year old male who presents with shortness of breath and cough.  He has had a productive cough for about 2 weeks.  He has had some progressive shortness of breath during that time.  He has had a little bit of nasal congestion.  No known fevers although he is felt hot.  He has had some posttussive emesis but no other vomiting or diarrhea.  No rashes.  No associated chest pain.  He has a history of childhood asthma but has not had any issues recently.       Home Medications Prior to Admission medications   Not on File      Allergies    Patient has no known allergies.    Review of Systems   Review of Systems  Constitutional:  Positive for fatigue and fever. Negative for chills and diaphoresis.  HENT:  Positive for congestion. Negative for rhinorrhea and sneezing.   Eyes: Negative.   Respiratory:  Positive for cough and shortness of breath. Negative for chest tightness.   Cardiovascular:  Negative for chest pain and leg swelling.  Gastrointestinal:  Positive for nausea and vomiting (Posttussive). Negative for abdominal pain, blood in stool and diarrhea.  Genitourinary:  Negative for difficulty urinating, flank pain, frequency and hematuria.  Musculoskeletal:  Negative for arthralgias and back pain.  Skin:  Negative for rash.  Neurological:  Negative for dizziness, speech difficulty, weakness, numbness and headaches.    Physical Exam Updated Vital Signs BP (!) 133/90   Pulse (!) 108   Temp 99.3 F (37.4 C) (Oral)   Resp 19   Ht 5\' 8"  (1.727 m)   Wt 101 kg   SpO2 99%   BMI 33.86 kg/m  Physical Exam Constitutional:      Appearance: He is well-developed.  HENT:     Head: Normocephalic and atraumatic.   Eyes:     Pupils: Pupils are equal, round, and reactive to light.  Cardiovascular:     Rate and Rhythm: Regular rhythm. Tachycardia present.     Heart sounds: Normal heart sounds.  Pulmonary:     Effort: Pulmonary effort is normal. No respiratory distress.     Breath sounds: Decreased breath sounds present. No wheezing or rales.  Chest:     Chest wall: No tenderness.  Abdominal:     General: Bowel sounds are normal.     Palpations: Abdomen is soft.     Tenderness: There is no abdominal tenderness. There is no guarding or rebound.  Musculoskeletal:        General: Normal range of motion.     Cervical back: Normal range of motion and neck supple.     Comments: No edema or calf tenderness  Lymphadenopathy:     Cervical: No cervical adenopathy.  Skin:    General: Skin is warm and dry.     Findings: No rash.  Neurological:     Mental Status: He is alert and oriented to person, place, and time.     ED Results / Procedures / Treatments   Labs (all labs ordered are listed, but only abnormal results are displayed) Labs Reviewed  BASIC METABOLIC PANEL - Abnormal; Notable for the following components:  Result Value   Sodium 133 (*)    Potassium 3.3 (*)    Chloride 97 (*)    Glucose, Bld 119 (*)    BUN 5 (*)    Calcium 8.7 (*)    All other components within normal limits  TROPONIN I (HIGH SENSITIVITY) - Abnormal; Notable for the following components:   Troponin I (High Sensitivity) 22 (*)    All other components within normal limits  TROPONIN I (HIGH SENSITIVITY) - Abnormal; Notable for the following components:   Troponin I (High Sensitivity) 22 (*)    All other components within normal limits  SARS CORONAVIRUS 2 BY RT PCR  CULTURE, BLOOD (ROUTINE X 2)  CULTURE, BLOOD (ROUTINE X 2)  CBC  D-DIMER, QUANTITATIVE  I-STAT CG4 LACTIC ACID, ED    EKG EKG Interpretation Date/Time:  Sunday March 22 2023 11:19:16 EDT Ventricular Rate:  139 PR Interval:  156 QRS  Duration:  80 QT Interval:  252 QTC Calculation: 383 R Axis:   2  Text Interpretation: Sinus tachycardia Minimal voltage criteria for LVH, may be normal variant ( R in aVL ) Cannot rule out Anterior infarct , age undetermined Abnormal ECG When compared with ECG of 12-Aug-2014 17:44, PREVIOUS ECG IS PRESENT SINCE LAST TRACING HEART RATE HAS INCREASED Confirmed by Rolan Bucco [phone]) on 03/22/2023 12:01:27 PM  Radiology CT Angio Chest PE W/Cm &/Or Wo Cm  Result Date: 03/22/2023 CLINICAL DATA:  Worsening shortness of breath for 2 weeks. Cough. High probability for pulmonary embolism. EXAM: CT ANGIOGRAPHY CHEST WITH CONTRAST TECHNIQUE: Multidetector CT imaging of the chest was performed using the standard protocol during bolus administration of intravenous contrast. Multiplanar CT image reconstructions and MIPs were obtained to evaluate the vascular anatomy. RADIATION DOSE REDUCTION: This exam was performed according to the departmental dose-optimization program which includes automated exposure control, adjustment of the mA and/or kV according to patient size and/or use of iterative reconstruction technique. CONTRAST:  75mL OMNIPAQUE IOHEXOL 350 MG/ML SOLN COMPARISON:  None Available. FINDINGS: Cardiovascular: Satisfactory opacification of pulmonary arteries noted, and no pulmonary emboli identified. No evidence of thoracic aortic dissection or aneurysm. Mediastinum/Nodes: No masses or pathologically enlarged lymph nodes identified. Lungs/Pleura: Heterogeneous ground-glass opacity and patchy areas of airspace disease are seen throughout both lungs. Broad differential diagnosis includes atypical infectious and inflammatory etiologies, and inhalational injury. No evidence of pulmonary mass or pleural effusion. Upper abdomen: No acute findings. Musculoskeletal: No suspicious bone lesions identified. Review of the MIP images confirms the above findings. IMPRESSION: No evidence of pulmonary embolism.  Heterogeneous ground-glass opacity and patchy areas of airspace disease throughout both lungs. Broad differential diagnosis includes atypical infectious and inflammatory etiologies, and inhalational injury. Electronically Signed   By: Danae Orleans M.D.   On: 03/22/2023 17:05   DG Chest 2 View  Result Date: 03/22/2023 CLINICAL DATA:  27 year old with shortness of breath. EXAM: CHEST - 2 VIEW COMPARISON:  12/02/2015 FINDINGS: Two views of the chest demonstrate low lung volumes. Slightly prominent lung markings may be associated with the low lung volumes. Linear densities in the perihilar regions. No pleural effusions. Heart size is normal. Trachea is midline. Negative for a pneumothorax. IMPRESSION: Low lung volumes with bilateral atelectasis and prominent lung markings. Cannot exclude atypical infection. Electronically Signed   By: Richarda Overlie M.D.   On: 03/22/2023 12:21    Procedures Procedures    Medications Ordered in ED Medications  acetaminophen (TYLENOL) tablet 650 mg (650 mg Oral Given 03/22/23 1231)  sodium chloride 0.9 %  bolus 1,000 mL (0 mLs Intravenous Stopped 03/22/23 1348)  albuterol (PROVENTIL) (2.5 MG/3ML) 0.083% nebulizer solution 5 mg (5 mg Nebulization Given 03/22/23 1231)  cefTRIAXone (ROCEPHIN) 1 g in sodium chloride 0.9 % 100 mL IVPB (0 g Intravenous Stopped 03/22/23 1649)  azithromycin (ZITHROMAX) 500 mg in sodium chloride 0.9 % 250 mL IVPB (500 mg Intravenous New Bag/Given 03/22/23 1702)  lactated ringers bolus 1,000 mL (0 mLs Intravenous Stopped 03/22/23 1622)  iohexol (OMNIPAQUE) 350 MG/ML injection 75 mL (75 mLs Intravenous Contrast Given 03/22/23 1552)    ED Course/ Medical Decision Making/ A&P                                 Medical Decision Making Amount and/or Complexity of Data Reviewed Labs: ordered. Radiology: ordered.  Risk OTC drugs. Prescription drug management. Decision regarding hospitalization.   Patient is a 27 year old male who presents with cough  and shortness of breath.  He was markedly tachycardic on arrival with a heart rate of 139 but he did have a low-grade temp.  Was given some Tylenol.  He was started on IV fluids.  His labs were nonconcerning.  He has a normal lactate.  His COVID test is negative.  Chest x-ray was interpreted by me and confirmed by the radiologist to show probable multifocal pneumonia.  He was given 2 L of LR.  He was tachycardic despite this.  I did a CT chest given his ongoing tachycardia and there is no evidence of PE.  No other acute abnormality other than the bilateral pneumonia.  Given his ongoing tachycardia, will plan admission.  He was given IV Rocephin and Zithromax.  I spoke with Dr. Debby Bud who will admit the patient for further treatment.  Final Clinical Impression(s) / ED Diagnoses Final diagnoses:  Community acquired pneumonia, unspecified laterality    Rx / DC Orders ED Discharge Orders     None         Rolan Bucco, MD 03/22/23 [phone]

## 2023-03-22 NOTE — ED Triage Notes (Signed)
Pt c/o productive cough w/clear mucous and SOBx2wks. Pt is eupneic.

## 2023-03-22 NOTE — ED Notes (Signed)
ED TO INPATIENT HANDOFF REPORT  ED Nurse Name and Phone #: Sharol Roussel, RN  S Name/Age/Gender Marvin Miller 27 y.o. male Room/Bed: 012C/012C  Code Status   Code Status: Full Code  Home/SNF/Other Home Patient oriented to: self, place, time, and situation Is this baseline? Yes   Triage Complete: Triage complete  Chief Complaint CAP (community acquired pneumonia) [J18.9]  Triage Note Pt c/o productive cough w/clear mucous and SOBx2wks. Pt is eupneic.   Allergies No Known Allergies  Level of Care/Admitting Diagnosis ED Disposition     ED Disposition  Admit   Condition  --   Comment  Hospital Area: [facility] [[id number]]  Level of Care: Med-Surg [16]  May place patient in observation at [facility] or [facility] if equivalent level of care is available:: No  Covid Evaluation: Confirmed COVID Negative  Diagnosis: CAP (community acquired pneumonia) [259563]  Admitting Physician: Jacques Navy [[id number]]  Attending Physician: Jacques Navy [[id number]]          B Medical/Surgery History Past Medical History:  Diagnosis Date   Asthma    Past Surgical History:  Procedure Laterality Date   APPENDECTOMY     TONSILLECTOMY     TYMPANOPLASTY       A IV Location/Drains/Wounds Patient Lines/Drains/Airways Status     Active Line/Drains/Airways     Name Placement date Placement time Site Days   Peripheral IV 03/22/23 20 G 1" Right Antecubital 03/22/23  1239  Antecubital  less than 1            Intake/Output Last 24 hours  Intake/Output Summary (Last 24 hours) at 03/22/2023 1951 Last data filed at 03/22/2023 1917 Gross per 24 hour  Intake 1336.21 ml  Output --  Net 1336.21 ml    Labs/Imaging Results for orders placed or performed during the hospital encounter of 03/22/23 (from the past 48 hour(s))  SARS Coronavirus 2 by RT PCR (hospital order, performed in [facility] hospital lab) *cepheid single result test* Anterior Nasal Swab      Status: None   Collection Time: 03/22/23 11:31 AM   Specimen: Anterior Nasal Swab  Result Value Ref Range   SARS Coronavirus 2 by RT PCR NEGATIVE NEGATIVE    Comment: Performed at [facility], [address], [place], [place] [address]  Basic metabolic panel     Status: Abnormal   Collection Time: 03/22/23 11:33 AM  Result Value Ref Range   Sodium 133 (L) 135 - 145 mmol/L   Potassium 3.3 (L) 3.5 - 5.1 mmol/L   Chloride 97 (L) 98 - 111 mmol/L   CO2 23 22 - 32 mmol/L   Glucose, Bld 119 (H) 70 - 99 mg/dL    Comment: Glucose reference range applies only to samples taken after fasting for at least 8 hours.   BUN 5 (L) 6 - 20 mg/dL   Creatinine, Ser 3.32 0.61 - 1.24 mg/dL   Calcium 8.7 (L) 8.9 - 10.3 mg/dL   GFR, Estimated >95 >18 mL/min    Comment: (NOTE) Calculated using the CKD-EPI Creatinine Equation (2021)    Anion gap 13 5 - 15    Comment: Performed at [facility], [address] [place], [place] [address]  CBC     Status: None   Collection Time: 03/22/23 11:33 AM  Result Value Ref Range   WBC 5.5 4.0 - 10.5 K/uL   RBC 4.73 4.22 - 5.81 MIL/uL   Hemoglobin 14.1 13.0 -  17.0 g/dL   HCT 62.7 03.5 - 00.9 %   MCV 85.2 80.0 - 100.0 fL   MCH 29.8 26.0 - 34.0 pg   MCHC 35.0 30.0 - 36.0 g/dL   RDW 38.1 82.9 - 93.7 %   Platelets 252 150 - 400 K/uL   nRBC 0.0 0.0 - 0.2 %    Comment: Performed at [facility], [address] [place], [place] [address]  Troponin I (High Sensitivity)     Status: Abnormal   Collection Time: 03/22/23 11:33 AM  Result Value Ref Range   Troponin I (High Sensitivity) 22 (H) <18 ng/L    Comment: (NOTE) Elevated high sensitivity troponin I (hsTnI) values and significant  changes across serial measurements may suggest ACS but many other  chronic and acute conditions are known to elevate hsTnI results.  Refer to the "Links" section for chest pain algorithms and additional  guidance. Performed at [facility], [address]  [place], [place] [address]   Troponin I (High Sensitivity)     Status: Abnormal   Collection Time: 03/22/23  1:49 PM  Result Value Ref Range   Troponin I (High Sensitivity) 22 (H) <18 ng/L    Comment: (NOTE) Elevated high sensitivity troponin I (hsTnI) values and significant  changes across serial measurements may suggest ACS but many other  chronic and acute conditions are known to elevate hsTnI results.  Refer to the "Links" section for chest pain algorithms and additional  guidance. Performed at [facility], [address] [place], [place] [address]   D-dimer, quantitative     Status: None   Collection Time: 03/22/23  1:49 PM  Result Value Ref Range   D-Dimer, Quant <0.27 0.00 - 0.50 ug/mL-FEU    Comment: (NOTE) At the manufacturer cut-off value of 0.5 g/mL FEU, this assay has a negative predictive value of 95-100%.This assay is intended for use in conjunction with a clinical pretest probability (PTP) assessment model to exclude pulmonary embolism (PE) and deep venous thrombosis (DVT) in outpatients suspected of PE or DVT. Results should be correlated with clinical presentation. Performed at [facility], [address], [place], [place] [address]   I-Stat Lactic Acid     Status: None   Collection Time: 03/22/23  3:49 PM  Result Value Ref Range   Lactic Acid, Venous 1.6 0.5 - 1.9 mmol/L   CT Angio Chest PE W/Cm &/Or Wo Cm  Result Date: 03/22/2023 CLINICAL DATA:  Worsening shortness of breath for 2 weeks. Cough. High probability for pulmonary embolism. EXAM: CT ANGIOGRAPHY CHEST WITH CONTRAST TECHNIQUE: Multidetector CT imaging of the chest was performed using the standard protocol during bolus administration of intravenous contrast. Multiplanar CT image reconstructions and MIPs were obtained to evaluate the vascular anatomy. RADIATION DOSE REDUCTION: This exam was performed according to the departmental dose-optimization program which includes automated exposure  control, adjustment of the mA and/or kV according to patient size and/or use of iterative reconstruction technique. CONTRAST:  75mL OMNIPAQUE IOHEXOL 350 MG/ML SOLN COMPARISON:  None Available. FINDINGS: Cardiovascular: Satisfactory opacification of pulmonary arteries noted, and no pulmonary emboli identified. No evidence of thoracic aortic dissection or aneurysm. Mediastinum/Nodes: No masses or pathologically enlarged lymph nodes identified. Lungs/Pleura: Heterogeneous ground-glass opacity and patchy areas of airspace disease are seen throughout both lungs. Broad differential diagnosis includes atypical infectious and inflammatory etiologies, and inhalational injury. No evidence of pulmonary mass or pleural effusion. Upper abdomen: No acute findings. Musculoskeletal: No suspicious bone  lesions identified. Review of the MIP images confirms the above findings. IMPRESSION: No evidence of pulmonary embolism. Heterogeneous ground-glass opacity and patchy areas of airspace disease throughout both lungs. Broad differential diagnosis includes atypical infectious and inflammatory etiologies, and inhalational injury. Electronically Signed   By: Danae Orleans M.D.   On: 03/22/2023 17:05   DG Chest 2 View  Result Date: 03/22/2023 CLINICAL DATA:  27 year old with shortness of breath. EXAM: CHEST - 2 VIEW COMPARISON:  12/02/2015 FINDINGS: Two views of the chest demonstrate low lung volumes. Slightly prominent lung markings may be associated with the low lung volumes. Linear densities in the perihilar regions. No pleural effusions. Heart size is normal. Trachea is midline. Negative for a pneumothorax. IMPRESSION: Low lung volumes with bilateral atelectasis and prominent lung markings. Cannot exclude atypical infection. Electronically Signed   By: Richarda Overlie M.D.   On: 03/22/2023 12:21    Pending Labs Unresulted Labs (From admission, onward)     Start     Ordered   03/29/23 0500  Creatinine, serum  (enoxaparin (LOVENOX)     CrCl >/= 30 ml/min)  Weekly,   R     Comments: while on enoxaparin therapy    03/22/23 1927   03/23/23 0500  CBC with Differential/Platelet  Tomorrow morning,   R        03/22/23 1927   03/22/23 1927  Respiratory (~20 pathogens) panel by PCR  (Respiratory panel by PCR (~20 pathogens, ~24 hr TAT)  w precautions)  Once,   R       Question:  Patient immune status  Answer:  Normal   03/22/23 1927   03/22/23 1927  Legionella Pneumophila Serogp 1 Ur Ag  Once,   R        03/22/23 1927   03/22/23 1926  HIV Antibody (routine testing w rflx)  (HIV Antibody (Routine testing w reflex) panel)  Add-on,   AD        03/22/23 1927   03/22/23 1523  Blood culture (routine x 2)  BLOOD CULTURE X 2,   R (with STAT occurrences)      03/22/23 1523            Vitals/Pain Today's Vitals   03/22/23 1230 03/22/23 1330 03/22/23 1522 03/22/23 1800  BP: (!) 155/98 (!) 139/93 (!) 151/98 (!) 133/90  Pulse: (!) 124 (!) 117 (!) 118 (!) 108  Resp: (!) 27 (!) 34 (!) 21 19  Temp:   99.3 F (37.4 C)   TempSrc:   Oral   SpO2: 95% 97% 99% 99%  Weight:      Height:      PainSc:        Isolation Precautions Droplet precaution  Medications Medications  enoxaparin (LOVENOX) injection 40 mg (has no administration in time range)  sodium chloride flush (NS) 0.9 % injection 3 mL (has no administration in time range)  sodium chloride flush (NS) 0.9 % injection 3 mL (has no administration in time range)  0.9 %  sodium chloride infusion (has no administration in time range)  cefTRIAXone (ROCEPHIN) 2 g in sodium chloride 0.9 % 100 mL IVPB (has no administration in time range)  azithromycin (ZITHROMAX) tablet 500 mg (has no administration in time range)  acetaminophen (TYLENOL) tablet 650 mg (650 mg Oral Given 03/22/23 1231)  sodium chloride 0.9 % bolus 1,000 mL (0 mLs Intravenous Stopped 03/22/23 1348)  albuterol (PROVENTIL) (2.5 MG/3ML) 0.083% nebulizer solution 5 mg (5 mg Nebulization Given 03/22/23 1231)  cefTRIAXone  (  ROCEPHIN) 1 g in sodium chloride 0.9 % 100 mL IVPB (0 g Intravenous Stopped 03/22/23 1649)  azithromycin (ZITHROMAX) 500 mg in sodium chloride 0.9 % 250 mL IVPB (0 mg Intravenous Stopped 03/22/23 1805)  lactated ringers bolus 1,000 mL (0 mLs Intravenous Stopped 03/22/23 1622)  iohexol (OMNIPAQUE) 350 MG/ML injection 75 mL (75 mLs Intravenous Contrast Given 03/22/23 1552)    Mobility walks     Focused Assessments Pulmonary Assessment Handoff:  Lung sounds: Diminished  O2 Device: Room Air      R Recommendations: See Admitting Provider Note  Report given to:   Additional Notes: N/A

## 2023-03-22 NOTE — Assessment & Plan Note (Signed)
Patient presents for evaluation due to persistent cough that is non-productive and SOB. No exposure risks. Denies documented fevers but has had hot flashes. Patient afebrile, O2 sat 99%, lungs clear on exam. CBCD nl, Cmet ok. CXR with low lung Volumes, Bilateral basilar atelectasis, atypical ASD. CTA chest - negative for PE, ground glass ASD both lungs. Influenza and covid negative.  Rec'd Rocephin and zithromax in ED  Plan Obs admit med-surg  No indication for continued abx  Respiratory panel ordered and pending  Urine Ag Legionela ordered  Procalcitonin ordered.

## 2023-03-22 NOTE — ED Notes (Signed)
LR started at this time.

## 2023-03-22 NOTE — Subjective & Objective (Signed)
Marvin Miller, a 27 y/o in good health reports several history of non-productive cough, hot flashes, dyspnea. He has had no exposure risks or life-style risks. He presented to [facility] for evaluation.

## 2023-03-23 ENCOUNTER — Other Ambulatory Visit (HOSPITAL_COMMUNITY): Payer: Self-pay

## 2023-03-23 DIAGNOSIS — E876 Hypokalemia: Secondary | ICD-10-CM | POA: Diagnosis present

## 2023-03-23 DIAGNOSIS — Z6833 Body mass index (BMI) 33.0-33.9, adult: Secondary | ICD-10-CM | POA: Diagnosis not present

## 2023-03-23 DIAGNOSIS — B20 Human immunodeficiency virus [HIV] disease: Secondary | ICD-10-CM | POA: Diagnosis present

## 2023-03-23 DIAGNOSIS — Z8249 Family history of ischemic heart disease and other diseases of the circulatory system: Secondary | ICD-10-CM | POA: Diagnosis not present

## 2023-03-23 DIAGNOSIS — Z825 Family history of asthma and other chronic lower respiratory diseases: Secondary | ICD-10-CM | POA: Diagnosis not present

## 2023-03-23 DIAGNOSIS — E871 Hypo-osmolality and hyponatremia: Secondary | ICD-10-CM | POA: Diagnosis present

## 2023-03-23 DIAGNOSIS — J45909 Unspecified asthma, uncomplicated: Secondary | ICD-10-CM

## 2023-03-23 DIAGNOSIS — E669 Obesity, unspecified: Secondary | ICD-10-CM | POA: Diagnosis present

## 2023-03-23 DIAGNOSIS — Z87891 Personal history of nicotine dependence: Secondary | ICD-10-CM | POA: Diagnosis not present

## 2023-03-23 DIAGNOSIS — D649 Anemia, unspecified: Secondary | ICD-10-CM | POA: Diagnosis present

## 2023-03-23 DIAGNOSIS — J189 Pneumonia, unspecified organism: Secondary | ICD-10-CM | POA: Diagnosis present

## 2023-03-23 DIAGNOSIS — R7402 Elevation of levels of lactic acid dehydrogenase (LDH): Secondary | ICD-10-CM | POA: Diagnosis present

## 2023-03-23 DIAGNOSIS — R0902 Hypoxemia: Secondary | ICD-10-CM | POA: Diagnosis not present

## 2023-03-23 DIAGNOSIS — Z1152 Encounter for screening for COVID-19: Secondary | ICD-10-CM | POA: Diagnosis not present

## 2023-03-23 DIAGNOSIS — B59 Pneumocystosis: Secondary | ICD-10-CM | POA: Diagnosis present

## 2023-03-23 DIAGNOSIS — Z833 Family history of diabetes mellitus: Secondary | ICD-10-CM | POA: Diagnosis not present

## 2023-03-23 LAB — HEPATITIS B SURFACE ANTIGEN: Hepatitis B Surface Ag: NONREACTIVE

## 2023-03-23 LAB — LACTATE DEHYDROGENASE: LDH: 266 U/L — ABNORMAL HIGH (ref 98–192)

## 2023-03-23 LAB — CRYPTOCOCCAL ANTIGEN: Crypto Ag: NEGATIVE

## 2023-03-23 LAB — HEPATITIS C ANTIBODY: HCV Ab: NONREACTIVE

## 2023-03-23 MED ORDER — BICTEGRAVIR-EMTRICITAB-TENOFOV 50-200-25 MG PO TABS
1.0000 | ORAL_TABLET | Freq: Every day | ORAL | Status: DC
  Administered 2023-03-23 – 2023-03-27 (×5): 1 via ORAL
  Filled 2023-03-23 (×5): qty 1

## 2023-03-23 MED ORDER — SODIUM CHLORIDE 0.9 % IV SOLN
1.0000 g | INTRAVENOUS | Status: DC
  Administered 2023-03-23 – 2023-03-24 (×2): 1 g via INTRAVENOUS
  Filled 2023-03-23 (×2): qty 10

## 2023-03-23 NOTE — Progress Notes (Signed)
PROGRESS NOTE    Marvin Miller  JSE:[id number] DOB: [birth year] DOA: 03/22/2023 PCP: Patient, No Pcp Per   Brief Narrative:  27 year old male with no past medical history presented with worsening shortness of breath, cough and dyspnea for few weeks.  On presentation, he was slightly intermittently tachycardic but required no supplemental oxygen.  CTA chest was negative for PE but showed groundglass patchy areas of airspace disease throughout both lungs.  Started on Rocephin and Zithromax.  Assessment & Plan:   Possible community-acquired bilateral impacted pneumonia -Presented with worsening shortness breath, cough and dyspnea for few weeks.  Imaging as above. -Procalcitonin less than one 0.1.  COVID-19 and respiratory virus panel PCR negative. -Currently on Rocephin and Zithromax.  Continue the same. -Currently on room air but patient does not feel much improved since admission.  Blood cultures negative so far  Hyponatremia -No labs today.  Repeat a.m. labs  Hypokalemia -No labs today.  Repeat a.m. labs  Obesity -Outpatient follow-up  Positive HIV antibody test -Unclear if this is true positive.  Spoke to Dr. Zenaida Niece Dam/ID on phone.  He will see the patient in consultation today.  Await confirmatory testing. -Unclear if patient's respiratory presentation is secondary to PJP pneumonia as well   DVT prophylaxis: Lovenox Code Status: Full Family Communication: None at bedside Disposition Plan: Status is: Observation The patient will require care spanning > 2 midnights and should be moved to inpatient because: Of severity of illness.  Need for IV antibiotics.  Consultants: ID  Procedures: None  Antimicrobials: Rocephin and Zithromax from 03/22/2023 onwards   Subjective: Patient seen and examined at bedside.  Feels the same since admission.  Still complains of intermittent shortness of breath with mostly whitish sputum.  No fever, vomiting reported.  Objective: Vitals:    03/22/23 1957 03/22/23 2051 03/23/23 0421 03/23/23 0758  BP: 133/80 (!) 148/98 (!) 147/95 129/77  Pulse:  (!) 108 (!) 110 (!) 109  Resp: (!) 27 20 18    Temp:  98.7 F (37.1 C) 99.8 F (37.7 C) 98.7 F (37.1 C)  TempSrc:  Oral Oral Oral  SpO2:  98% (!) 87% 93%  Weight:      Height:        Intake/Output Summary (Last 24 hours) at 03/23/2023 1032 Last data filed at 03/22/2023 1917 Gross per 24 hour  Intake 1336.21 ml  Output --  Net 1336.21 ml   Filed Weights   03/22/23 1128  Weight: 101 kg    Examination:  General exam: Appears calm and comfortable.  On room air. Respiratory system: Bilateral decreased breath sounds at bases with some scattered crackles Cardiovascular system: S1 & S2 heard, Rate controlled Gastrointestinal system: Abdomen is obese, nondistended, soft and nontender. Normal bowel sounds heard. Extremities: No cyanosis, clubbing, edema  Central nervous system: Alert and oriented. No focal neurological deficits. Moving extremities Skin: No rashes, lesions or ulcers Psychiatry: Judgement and insight appear normal. Mood & affect appropriate.     Data Reviewed: I have personally reviewed following labs and imaging studies  CBC: Recent Labs  Lab 03/22/23 1133 03/23/23 0601  WBC 5.5 4.7  NEUTROABS  --  3.6  HGB 14.1 12.9*  HCT 40.3 36.2*  MCV 85.2 83.6  PLT 252 PLATELET CLUMPS NOTED ON SMEAR, UNABLE TO ESTIMATE   Basic Metabolic Panel: Recent Labs  Lab 03/22/23 1133  NA 133*  K 3.3*  CL 97*  CO2 23  GLUCOSE 119*  BUN 5*  CREATININE 0.73  CALCIUM 8.7*  GFR: Estimated Creatinine Clearance: 159.7 mL/min (by C-G formula based on SCr of 0.73 mg/dL). Liver Function Tests: No results for input(s): "AST", "ALT", "ALKPHOS", "BILITOT", "PROT", "ALBUMIN" in the last 168 hours. No results for input(s): "LIPASE", "AMYLASE" in the last 168 hours. No results for input(s): "AMMONIA" in the last 168 hours. Coagulation Profile: No results for input(s):  "INR", "PROTIME" in the last 168 hours. Cardiac Enzymes: No results for input(s): "CKTOTAL", "CKMB", "CKMBINDEX", "TROPONINI" in the last 168 hours. BNP (last 3 results) No results for input(s): "PROBNP" in the last 8760 hours. HbA1C: No results for input(s): "HGBA1C" in the last 72 hours. CBG: No results for input(s): "GLUCAP" in the last 168 hours. Lipid Profile: No results for input(s): "CHOL", "HDL", "LDLCALC", "TRIG", "CHOLHDL", "LDLDIRECT" in the last 72 hours. Thyroid Function Tests: No results for input(s): "TSH", "T4TOTAL", "FREET4", "T3FREE", "THYROIDAB" in the last 72 hours. Anemia Panel: No results for input(s): "VITAMINB12", "FOLATE", "FERRITIN", "TIBC", "IRON", "RETICCTPCT" in the last 72 hours. Sepsis Labs: Recent Labs  Lab 03/22/23 1549 03/22/23 2209  PROCALCITON  --  <0.10  LATICACIDVEN 1.6  --     Recent Results (from the past 240 hour(s))  SARS Coronavirus 2 by RT PCR (hospital order, performed in [facility] hospital lab) *cepheid single result test* Anterior Nasal Swab     Status: None   Collection Time: 03/22/23 11:31 AM   Specimen: Anterior Nasal Swab  Result Value Ref Range Status   SARS Coronavirus 2 by RT PCR NEGATIVE NEGATIVE Final    Comment: Performed at [facility], [address] [place], [place] [address]  Blood culture (routine x 2)     Status: None (Preliminary result)   Collection Time: 03/22/23  3:46 PM   Specimen: BLOOD  Result Value Ref Range Status   Specimen Description BLOOD LEFT ANTECUBITAL  Final   Special Requests   Final    BOTTLES DRAWN AEROBIC AND ANAEROBIC Blood Culture adequate volume   Culture   Final    NO GROWTH < 24 HOURS Performed at [facility], [address] [place], [place] [address]    Report Status PENDING  Incomplete  Blood culture (routine x 2)     Status: None (Preliminary result)   Collection Time: 03/22/23  3:46 PM   Specimen: BLOOD  Result Value Ref Range Status   Specimen Description  BLOOD RIGHT ANTECUBITAL  Final   Special Requests   Final    BOTTLES DRAWN AEROBIC AND ANAEROBIC Blood Culture adequate volume   Culture   Final    NO GROWTH < 24 HOURS Performed at [facility], [address], [place], [place] [address]    Report Status PENDING  Incomplete  Respiratory (~20 pathogens) panel by PCR     Status: None   Collection Time: 03/22/23  8:04 PM   Specimen: Respiratory  Result Value Ref Range Status   Adenovirus NOT DETECTED NOT DETECTED Final   Coronavirus 229E NOT DETECTED NOT DETECTED Final    Comment: (NOTE) The Coronavirus on the Respiratory Panel, DOES NOT test for the novel  Coronavirus (2019 nCoV)    Coronavirus HKU1 NOT DETECTED NOT DETECTED Final   Coronavirus NL63 NOT DETECTED NOT DETECTED Final   Coronavirus OC43 NOT DETECTED NOT DETECTED Final   Metapneumovirus NOT DETECTED NOT DETECTED Final   Rhinovirus / Enterovirus NOT DETECTED NOT DETECTED Final   Influenza A NOT DETECTED NOT DETECTED Final   Influenza B NOT DETECTED NOT DETECTED  Final   Parainfluenza Virus 1 NOT DETECTED NOT DETECTED Final   Parainfluenza Virus 2 NOT DETECTED NOT DETECTED Final   Parainfluenza Virus 3 NOT DETECTED NOT DETECTED Final   Parainfluenza Virus 4 NOT DETECTED NOT DETECTED Final   Respiratory Syncytial Virus NOT DETECTED NOT DETECTED Final   Bordetella pertussis NOT DETECTED NOT DETECTED Final   Bordetella Parapertussis NOT DETECTED NOT DETECTED Final   Chlamydophila pneumoniae NOT DETECTED NOT DETECTED Final   Mycoplasma pneumoniae NOT DETECTED NOT DETECTED Final    Comment: Performed at [facility], [address], [place], [place] [address]         Radiology Studies: CT Angio Chest PE W/Cm &/Or Wo Cm  Result Date: 03/22/2023 CLINICAL DATA:  Worsening shortness of breath for 2 weeks. Cough. High probability for pulmonary embolism. EXAM: CT ANGIOGRAPHY CHEST WITH CONTRAST TECHNIQUE: Multidetector CT imaging of the chest was performed using  the standard protocol during bolus administration of intravenous contrast. Multiplanar CT image reconstructions and MIPs were obtained to evaluate the vascular anatomy. RADIATION DOSE REDUCTION: This exam was performed according to the departmental dose-optimization program which includes automated exposure control, adjustment of the mA and/or kV according to patient size and/or use of iterative reconstruction technique. CONTRAST:  75mL OMNIPAQUE IOHEXOL 350 MG/ML SOLN COMPARISON:  None Available. FINDINGS: Cardiovascular: Satisfactory opacification of pulmonary arteries noted, and no pulmonary emboli identified. No evidence of thoracic aortic dissection or aneurysm. Mediastinum/Nodes: No masses or pathologically enlarged lymph nodes identified. Lungs/Pleura: Heterogeneous ground-glass opacity and patchy areas of airspace disease are seen throughout both lungs. Broad differential diagnosis includes atypical infectious and inflammatory etiologies, and inhalational injury. No evidence of pulmonary mass or pleural effusion. Upper abdomen: No acute findings. Musculoskeletal: No suspicious bone lesions identified. Review of the MIP images confirms the above findings. IMPRESSION: No evidence of pulmonary embolism. Heterogeneous ground-glass opacity and patchy areas of airspace disease throughout both lungs. Broad differential diagnosis includes atypical infectious and inflammatory etiologies, and inhalational injury. Electronically Signed   By: Danae Orleans M.D.   On: 03/22/2023 17:05   DG Chest 2 View  Result Date: 03/22/2023 CLINICAL DATA:  27 year old with shortness of breath. EXAM: CHEST - 2 VIEW COMPARISON:  12/02/2015 FINDINGS: Two views of the chest demonstrate low lung volumes. Slightly prominent lung markings may be associated with the low lung volumes. Linear densities in the perihilar regions. No pleural effusions. Heart size is normal. Trachea is midline. Negative for a pneumothorax. IMPRESSION: Low lung  volumes with bilateral atelectasis and prominent lung markings. Cannot exclude atypical infection. Electronically Signed   By: Richarda Overlie M.D.   On: 03/22/2023 12:21        Scheduled Meds:  azithromycin  500 mg Oral Daily   enoxaparin (LOVENOX) injection  40 mg Subcutaneous Q24H   sodium chloride flush  3 mL Intravenous Q12H   Continuous Infusions:  sodium chloride     cefTRIAXone (ROCEPHIN)  IV 1 g (03/23/23 1005)          Glade Lloyd, MD [facility] 03/23/2023, 10:32 AM

## 2023-03-23 NOTE — Plan of Care (Signed)

## 2023-03-23 NOTE — Consult Note (Signed)
Date of Admission:  03/22/2023          Reason for Consult: Preliminary + HIV test   Referring Provider: auto consult ([facility]) and.Kshitiz Hanley Ben, MD   Assessment:  Likely newly diagnosed HIV infection Heterogeneous multifocal pneumonia consistent with a more atypical cause for pneumonia Recent trip to [facility] with his mother who were recently recovering from respiratory illness Asthma as a child   Plan:  Check HIV RNA quant, CD4, hepatitis panel,  Ordered genotype for am labs Start Biktarvy Continue azithromycin and rocephin IF his CD4 is below 200 I would pivot to PCP treatment LDH, Fungitel, serum crypto ag, and cocci antibodies, histoplasma and blastomyces ag in urine   Principal Problem:   CAP (community acquired pneumonia) Active Problems:   Pneumonia   Scheduled Meds:  azithromycin  500 mg Oral Daily   bictegravir-emtricitabine-tenofovir AF  1 tablet Oral Daily   enoxaparin (LOVENOX) injection  40 mg Subcutaneous Q24H   sodium chloride flush  3 mL Intravenous Q12H   Continuous Infusions:  sodium chloride     cefTRIAXone (ROCEPHIN)  IV 1 g (03/23/23 1005)   PRN Meds:.sodium chloride, acetaminophen, sodium chloride flush  HPI: Marvin Miller is a 27 y.o. male black man who reportedly most recently tested for HIV earlier this month but results not back yet, with hx of of negative test last January of 2024 who to the [facility], ER with a several week history of nonproductive cough hot flashes dyspnea.  In the ER chest x-ray was unrevealing but CT angiogram and showed groundglass opacities bilaterally in the upper lobes predominantly.  His respiratory panel was negative as was his COVID-19 PCR.  Blood cultures were taken and have not shown any growth of any organism he was placed on ceftriaxone and azithromycin his HIV test is preliminarily positive.  His absolute lymphocyte count is not terribly low and he claims to have tested negative for HIV in  January 2024.  At present I am holding off on pivoting to treatment for PCP pneumonia though his fairly atypical infiltrates on CT scan make me a bit worried for this as it does his elevated LDH.  It would however be quite unusual for his CD4 count to have dropped this fast if he truly had a negative test in January 2024.  If his CD4 count comes back lower than 200 I will pivot to treatment for PCP pneumonia.  The fact that he was out in the [employer] with his mother who had a respiratory illness herself would raise the possibility of coccidiomycosis infection and valley fever.  Typically this does NOT treatment in any immunocompetent adult.  I will go ahead and order a serum cryptococcal antigen urine histoplasma antigen and Coccidioides antibodies.  I have also added a Fungitell.  His HIV RNA was ordered today and I will order genotype tomorrow.  The patient was comfortable with initiating antiretroviral therapy pending confirmatory tests.  IF he is HIV negative he is very much interested in PrEP  I have personally spent 86 minutes involved in face-to-face and non-face-to-face activities for this patient on the day of the visit. Professional time spent includes the following activities: Preparing to see the patient (review of tests), Obtaining and/or reviewing separately obtained history (admission/discharge record), Performing a medically appropriate examination and/or evaluation , Ordering medications/tests/procedures, referring and communicating with other health care professionals, Documenting clinical information in the EMR, Independently interpreting results (not separately reported), Communicating  results to the patient/family/caregiver, Counseling and educating the patient/family/caregiver and Care coordination (not separately reported).    Review of Systems: ROS  Past Medical History:  Diagnosis Date   Asthma     Social History   Tobacco Use   Smoking status:  Former    Types: Cigarettes   Smokeless tobacco: Never  Vaping Use   Vaping status: Never Used  Substance Use Topics   Alcohol use: No   Drug use: No    Family History  Problem Relation Age of Onset   Hypertension Mother    Asthma Mother    Heart failure Father    Diabetes Father    Cancer Neg Hx    No Known Allergies  OBJECTIVE: Blood pressure (!) 147/99, pulse (!) 108, temperature 98.6 F (37 C), temperature source Oral, resp. rate 18, height 5\' 8"  (1.727 m), weight 101 kg, SpO2 96%.  Physical Exam  Lab Results Lab Results  Component Value Date   WBC 4.7 03/23/2023   HGB 12.9 (L) 03/23/2023   HCT 36.2 (L) 03/23/2023   MCV 83.6 03/23/2023   PLT PLATELET CLUMPS NOTED ON SMEAR, UNABLE TO ESTIMATE 03/23/2023    Lab Results  Component Value Date   CREATININE 0.73 03/22/2023   BUN 5 (L) 03/22/2023   NA 133 (L) 03/22/2023   K 3.3 (L) 03/22/2023   CL 97 (L) 03/22/2023   CO2 23 03/22/2023    Lab Results  Component Value Date   ALT 16 01/19/2023   AST 18 01/19/2023   ALKPHOS 31 (L) 01/19/2023   BILITOT 0.6 01/19/2023     Microbiology: Recent Results (from the past 240 hour(s))  SARS Coronavirus 2 by RT PCR (hospital order, performed in [facility] hospital lab) *cepheid single result test* Anterior Nasal Swab     Status: None   Collection Time: 03/22/23 11:31 AM   Specimen: Anterior Nasal Swab  Result Value Ref Range Status   SARS Coronavirus 2 by RT PCR NEGATIVE NEGATIVE Final    Comment: Performed at [facility], [address] [place], [place] [address]  Blood culture (routine x 2)     Status: None (Preliminary result)   Collection Time: 03/22/23  3:46 PM   Specimen: BLOOD  Result Value Ref Range Status   Specimen Description BLOOD LEFT ANTECUBITAL  Final   Special Requests   Final    BOTTLES DRAWN AEROBIC AND ANAEROBIC Blood Culture adequate volume   Culture   Final    NO GROWTH < 24 HOURS Performed at [facility], [address],  [place], [place] [address]    Report Status PENDING  Incomplete  Blood culture (routine x 2)     Status: None (Preliminary result)   Collection Time: 03/22/23  3:46 PM   Specimen: BLOOD  Result Value Ref Range Status   Specimen Description BLOOD RIGHT ANTECUBITAL  Final   Special Requests   Final    BOTTLES DRAWN AEROBIC AND ANAEROBIC Blood Culture adequate volume   Culture   Final    NO GROWTH < 24 HOURS Performed at [facility], [address], [place], [place] [address]    Report Status PENDING  Incomplete  Respiratory (~20 pathogens) panel by PCR     Status: None   Collection Time: 03/22/23  8:04 PM   Specimen: Respiratory  Result Value Ref Range Status   Adenovirus NOT DETECTED NOT DETECTED Final   Coronavirus 229E NOT DETECTED NOT DETECTED Final  Comment: (NOTE) The Coronavirus on the Respiratory Panel, DOES NOT test for the novel  Coronavirus (2019 nCoV)    Coronavirus HKU1 NOT DETECTED NOT DETECTED Final   Coronavirus NL63 NOT DETECTED NOT DETECTED Final   Coronavirus OC43 NOT DETECTED NOT DETECTED Final   Metapneumovirus NOT DETECTED NOT DETECTED Final   Rhinovirus / Enterovirus NOT DETECTED NOT DETECTED Final   Influenza A NOT DETECTED NOT DETECTED Final   Influenza B NOT DETECTED NOT DETECTED Final   Parainfluenza Virus 1 NOT DETECTED NOT DETECTED Final   Parainfluenza Virus 2 NOT DETECTED NOT DETECTED Final   Parainfluenza Virus 3 NOT DETECTED NOT DETECTED Final   Parainfluenza Virus 4 NOT DETECTED NOT DETECTED Final   Respiratory Syncytial Virus NOT DETECTED NOT DETECTED Final   Bordetella pertussis NOT DETECTED NOT DETECTED Final   Bordetella Parapertussis NOT DETECTED NOT DETECTED Final   Chlamydophila pneumoniae NOT DETECTED NOT DETECTED Final   Mycoplasma pneumoniae NOT DETECTED NOT DETECTED Final    Comment: Performed at [facility], [address], [place], [place] [address]    Acey Lav, MD [facility] [phone] pager  03/23/2023, 2:47 PM

## 2023-03-24 ENCOUNTER — Other Ambulatory Visit (HOSPITAL_COMMUNITY): Payer: Self-pay

## 2023-03-24 ENCOUNTER — Telehealth (HOSPITAL_COMMUNITY): Payer: Self-pay | Admitting: Pharmacy Technician

## 2023-03-24 DIAGNOSIS — J45909 Unspecified asthma, uncomplicated: Secondary | ICD-10-CM | POA: Diagnosis not present

## 2023-03-24 DIAGNOSIS — J189 Pneumonia, unspecified organism: Secondary | ICD-10-CM | POA: Diagnosis not present

## 2023-03-24 DIAGNOSIS — B59 Pneumocystosis: Secondary | ICD-10-CM | POA: Diagnosis present

## 2023-03-24 DIAGNOSIS — B20 Human immunodeficiency virus [HIV] disease: Secondary | ICD-10-CM | POA: Diagnosis not present

## 2023-03-24 LAB — LEGIONELLA PNEUMOPHILA SEROGP 1 UR AG: L. pneumophila Serogp 1 Ur Ag: NEGATIVE

## 2023-03-24 MED ORDER — PREDNISONE 20 MG PO TABS
40.0000 mg | ORAL_TABLET | Freq: Two times a day (BID) | ORAL | Status: DC
  Administered 2023-03-24 – 2023-03-27 (×8): 40 mg via ORAL
  Filled 2023-03-24 (×8): qty 2

## 2023-03-24 MED ORDER — SULFAMETHOXAZOLE-TRIMETHOPRIM 800-160 MG PO TABS
2.0000 | ORAL_TABLET | Freq: Three times a day (TID) | ORAL | Status: DC
  Administered 2023-03-24 – 2023-03-27 (×11): 2 via ORAL
  Filled 2023-03-24 (×11): qty 2

## 2023-03-24 MED ORDER — SULFAMETHOXAZOLE-TRIMETHOPRIM 800-160 MG PO TABS: 1.0000 | ORAL_TABLET | Freq: Every day | ORAL | Status: DC

## 2023-03-24 MED ORDER — PREDNISONE 20 MG PO TABS: 40.0000 mg | ORAL_TABLET | Freq: Every day | ORAL | Status: DC

## 2023-03-24 MED ORDER — PREDNISONE 20 MG PO TABS: 20.0000 mg | ORAL_TABLET | Freq: Every day | ORAL | Status: DC

## 2023-03-24 NOTE — Plan of Care (Signed)

## 2023-03-24 NOTE — Progress Notes (Signed)
SATURATION QUALIFICATIONS: (This note is used to comply with regulatory documentation for home oxygen)  Patient Saturations on Room Air at Rest = 95% room air [id number]  Patient Saturations on Room Air while Ambulating = 93% room air HH- 135  Patient Saturations on 0 Liters of oxygen while Ambulating = oxygen not needed  Please briefly explain why patient needs home oxygen: Pt was bale to maintain oxygen saturation above 90% room air while ambulating

## 2023-03-24 NOTE — Progress Notes (Signed)
Subjective:  Patient still having trouble with coughing and dyspnea and feeling exhausted after walking to the bathroom and back  Antibiotics:  Anti-infectives (From admission, onward)    Start     Dose/Rate Route Frequency Ordered Stop   04/14/23 1000  sulfamethoxazole-trimethoprim (BACTRIM DS) 800-160 MG per tablet 1 tablet       Placed in "Followed by" Linked Group   1 tablet Oral Daily 03/24/23 1013 05/05/23 0959   03/24/23 1100  sulfamethoxazole-trimethoprim (BACTRIM DS) 800-160 MG per tablet 2 tablet       Placed in "Followed by" Linked Group   2 tablet Oral 3 times daily 03/24/23 1013 04/14/23 0959   03/23/23 1600  cefTRIAXone (ROCEPHIN) 2 g in sodium chloride 0.9 % 100 mL IVPB  Status:  Discontinued        2 g 200 mL/hr over 30 Minutes Intravenous Every 24 hours 03/22/23 1927 03/22/23 2010   03/23/23 1315  bictegravir-emtricitabine-tenofovir AF (BIKTARVY) 50-200-25 MG per tablet 1 tablet        1 tablet Oral Daily 03/23/23 1228     03/23/23 1000  azithromycin (ZITHROMAX) tablet 500 mg  Status:  Discontinued        500 mg Oral Daily 03/22/23 1927 03/24/23 1013   03/23/23 0806  cefTRIAXone (ROCEPHIN) 1 g in sodium chloride 0.9 % 100 mL IVPB  Status:  Discontinued        1 g 200 mL/hr over 30 Minutes Intravenous Every 24 hours 03/23/23 0806 03/24/23 1013   03/22/23 1515  cefTRIAXone (ROCEPHIN) 1 g in sodium chloride 0.9 % 100 mL IVPB        1 g 200 mL/hr over 30 Minutes Intravenous  Once 03/22/23 1503 03/22/23 1649   03/22/23 1515  azithromycin (ZITHROMAX) 500 mg in sodium chloride 0.9 % 250 mL IVPB        500 mg 250 mL/hr over 60 Minutes Intravenous  Once 03/22/23 1503 03/22/23 1805       Medications: Scheduled Meds:  bictegravir-emtricitabine-tenofovir AF  1 tablet Oral Daily   enoxaparin (LOVENOX) injection  40 mg Subcutaneous Q24H   predniSONE  40 mg Oral BID WC   Followed by   Melene Muller ON 03/29/2023] predniSONE  40 mg Oral Q breakfast   Followed by    Melene Muller ON 04/03/2023] predniSONE  20 mg Oral Q breakfast   sodium chloride flush  3 mL Intravenous Q12H   sulfamethoxazole-trimethoprim  2 tablet Oral TID   Followed by   Melene Muller ON 04/14/2023] sulfamethoxazole-trimethoprim  1 tablet Oral Daily   Continuous Infusions:  sodium chloride     PRN Meds:.sodium chloride, acetaminophen, sodium chloride flush    Objective: Weight change:   Intake/Output Summary (Last 24 hours) at 03/24/2023 1503 Last data filed at 03/24/2023 0937 Gross per 24 hour  Intake 480 ml  Output --  Net 480 ml   Blood pressure 132/79, pulse (!) 105, temperature 99.8 F (37.7 C), temperature source Oral, resp. rate 17, height 5\' 8"  (1.727 m), weight 101 kg, SpO2 93%. Temp:  [98.3 F (36.8 C)-99.8 F (37.7 C)] 99.8 F (37.7 C) (08/13 1424) Pulse Rate:  [105-126] 105 (08/13 1424) Resp:  [17-18] 17 (08/13 1424) BP: (126-141)/(79-88) 132/79 (08/13 1424) SpO2:  [92 %-95 %] 93 % (08/13 1424)  Physical Exam: Physical Exam Constitutional:      Appearance: He is well-developed.  HENT:     Head: Normocephalic and atraumatic.  Eyes:     Conjunctiva/sclera:  Conjunctivae normal.  Cardiovascular:     Rate and Rhythm: Normal rate and regular rhythm.  Pulmonary:     Effort: Pulmonary effort is normal. No respiratory distress.     Breath sounds: Normal breath sounds. No stridor. No wheezing.  Abdominal:     General: There is no distension.     Palpations: Abdomen is soft.  Musculoskeletal:        General: Normal range of motion.     Cervical back: Normal range of motion and neck supple.  Skin:    General: Skin is warm and dry.     Findings: No erythema or rash.  Neurological:     General: No focal deficit present.     Mental Status: He is alert and oriented to person, place, and time.  Psychiatric:        Mood and Affect: Mood normal.        Behavior: Behavior normal.        Thought Content: Thought content normal.        Judgment: Judgment normal.       CBC:    BMET Recent Labs    03/22/23 1133 03/24/23 0218  NA 133* 132*  K 3.3* 3.5  CL 97* 97*  CO2 23 24  GLUCOSE 119* 110*  BUN 5* <5*  CREATININE 0.73 0.70  CALCIUM 8.7* 8.6*     Liver Panel  Recent Labs    03/24/23 0218  PROT 6.0*  ALBUMIN 2.8*  AST 20  ALT 17  ALKPHOS 29*  BILITOT 0.5       Sedimentation Rate No results for input(s): "ESRSEDRATE" in the last 72 hours. C-Reactive Protein No results for input(s): "CRP" in the last 72 hours.  Micro Results: Recent Results (from the past 720 hour(s))  SARS Coronavirus 2 by RT PCR (hospital order, performed in [facility] hospital lab) *cepheid single result test* Anterior Nasal Swab     Status: None   Collection Time: 03/22/23 11:31 AM   Specimen: Anterior Nasal Swab  Result Value Ref Range Status   SARS Coronavirus 2 by RT PCR NEGATIVE NEGATIVE Final    Comment: Performed at [facility], [address] [place], [place] [address]  Blood culture (routine x 2)     Status: None (Preliminary result)   Collection Time: 03/22/23  3:46 PM   Specimen: BLOOD  Result Value Ref Range Status   Specimen Description BLOOD LEFT ANTECUBITAL  Final   Special Requests   Final    BOTTLES DRAWN AEROBIC AND ANAEROBIC Blood Culture adequate volume   Culture   Final    NO GROWTH 2 DAYS Performed at [facility], [address], [place], [place] [address]    Report Status PENDING  Incomplete  Blood culture (routine x 2)     Status: None (Preliminary result)   Collection Time: 03/22/23  3:46 PM   Specimen: BLOOD  Result Value Ref Range Status   Specimen Description BLOOD RIGHT ANTECUBITAL  Final   Special Requests   Final    BOTTLES DRAWN AEROBIC AND ANAEROBIC Blood Culture adequate volume   Culture   Final    NO GROWTH 2 DAYS Performed at [facility], [address], [place], [place] [address]    Report Status PENDING  Incomplete  Respiratory (~20 pathogens) panel by PCR     Status: None    Collection Time: 03/22/23  8:04 PM   Specimen: Respiratory  Result Value Ref Range  Status   Adenovirus NOT DETECTED NOT DETECTED Final   Coronavirus 229E NOT DETECTED NOT DETECTED Final    Comment: (NOTE) The Coronavirus on the Respiratory Panel, DOES NOT test for the novel  Coronavirus (2019 nCoV)    Coronavirus HKU1 NOT DETECTED NOT DETECTED Final   Coronavirus NL63 NOT DETECTED NOT DETECTED Final   Coronavirus OC43 NOT DETECTED NOT DETECTED Final   Metapneumovirus NOT DETECTED NOT DETECTED Final   Rhinovirus / Enterovirus NOT DETECTED NOT DETECTED Final   Influenza A NOT DETECTED NOT DETECTED Final   Influenza B NOT DETECTED NOT DETECTED Final   Parainfluenza Virus 1 NOT DETECTED NOT DETECTED Final   Parainfluenza Virus 2 NOT DETECTED NOT DETECTED Final   Parainfluenza Virus 3 NOT DETECTED NOT DETECTED Final   Parainfluenza Virus 4 NOT DETECTED NOT DETECTED Final   Respiratory Syncytial Virus NOT DETECTED NOT DETECTED Final   Bordetella pertussis NOT DETECTED NOT DETECTED Final   Bordetella Parapertussis NOT DETECTED NOT DETECTED Final   Chlamydophila pneumoniae NOT DETECTED NOT DETECTED Final   Mycoplasma pneumoniae NOT DETECTED NOT DETECTED Final    Comment: Performed at [facility], [address] [place], [place] [address]    Studies/Results: CT Angio Chest PE W/Cm &/Or Wo Cm  Result Date: 03/22/2023 CLINICAL DATA:  Worsening shortness of breath for 2 weeks. Cough. High probability for pulmonary embolism. EXAM: CT ANGIOGRAPHY CHEST WITH CONTRAST TECHNIQUE: Multidetector CT imaging of the chest was performed using the standard protocol during bolus administration of intravenous contrast. Multiplanar CT image reconstructions and MIPs were obtained to evaluate the vascular anatomy. RADIATION DOSE REDUCTION: This exam was performed according to the departmental dose-optimization program which includes automated exposure control, adjustment of the mA and/or kV according to  patient size and/or use of iterative reconstruction technique. CONTRAST:  75mL OMNIPAQUE IOHEXOL 350 MG/ML SOLN COMPARISON:  None Available. FINDINGS: Cardiovascular: Satisfactory opacification of pulmonary arteries noted, and no pulmonary emboli identified. No evidence of thoracic aortic dissection or aneurysm. Mediastinum/Nodes: No masses or pathologically enlarged lymph nodes identified. Lungs/Pleura: Heterogeneous ground-glass opacity and patchy areas of airspace disease are seen throughout both lungs. Broad differential diagnosis includes atypical infectious and inflammatory etiologies, and inhalational injury. No evidence of pulmonary mass or pleural effusion. Upper abdomen: No acute findings. Musculoskeletal: No suspicious bone lesions identified. Review of the MIP images confirms the above findings. IMPRESSION: No evidence of pulmonary embolism. Heterogeneous ground-glass opacity and patchy areas of airspace disease throughout both lungs. Broad differential diagnosis includes atypical infectious and inflammatory etiologies, and inhalational injury. Electronically Signed   By: Danae Orleans M.D.   On: 03/22/2023 17:05      Assessment/Plan:  INTERVAL HISTORY: CD4 came back this am at <35 (HIV discrim test subsequently come back + with HIV RNA quant above 200K   Principal Problem:   CAP (community acquired pneumonia) Active Problems:   Pneumonia    Marvin Miller is a 27 y.o. male with newly diagnosed HIV/AIDS and likely CP pneumonia.  #1 Likely PCP pneumonia:  Will to treat presumptively for PCP pneumonia with 2 double strength Bactrim tablets 3 times daily and prednisone taper  Serologies and antigen tests are pending.  If he fails to improve on empiric therapy for PCP pneumonia he will need a bronchoscopy with BAL for AFB cultures as well as fungal cultures.  The fact that he was in [facility] before would raise the possibility of coccidiomycosis being a pathogen here  Started  ARV   #  2 HIV/AIDS  CD4 nadir is <35  Sinew Biktarvy  If he takes a long time to reconstitute his immune system could consider the addition of Rukobia but much further down the line  I have personally spent 56 minutes involved in face-to-face and non-face-to-face activities for this patient on the day of the visit. Professional time spent includes the following activities: Preparing to see the patient (review of tests), Obtaining and/or reviewing separately obtained history (admission/discharge record), Performing a medically appropriate examination and/or evaluation , Ordering medications/tests/procedures, referring and communicating with other health care professionals, Documenting clinical information in the EMR, Independently interpreting results (not separately reported), Communicating results to the patient/family/caregiver, Counseling and educating the patient/family/caregiver and Care coordination (not separately reported).     LOS: 1 day   Acey Lav 03/24/2023, 3:03 PM

## 2023-03-24 NOTE — TOC CM/SW Note (Signed)
Transition of Care [facility]) - Inpatient Brief Assessment   Patient Details  Name: Marvin Miller MRN: [id number] Date of Birth: 03-11-1996  Transition of Care [facility]) CM/SW Contact:    Epifanio Lesches, RN Phone Number: 03/24/2023, 2:41 PM   Clinical Narrative: Presents with persistent cough /SOB/ CAP.  From home alone. Supportive mom. Independent with ADL's, no DME usage.  Pt without PCP. NCM arrange post hospital f/u with [facility], pt aware. [facility]   [phone] [place] [address]     Next Steps: Follow up on 04/02/2023 Instructions: Post hospital follow up scheduled for 04/02/2023 at 1:50 pm with Georgian Co PA   ID following, positive HIV antibody test    [facility] team following and will assist with needs....  Transition of Care Asessment: Insurance and Status: Insurance coverage has been reviewed Patient has primary care physician: No (Post hospital f/u and to establish PCP scheduled @ [facility] and noted on AVS) Home environment has been reviewed: Lives alone Prior level of function:: PTA independent with ADL's, no DME usage Prior/Current Home Services: No current home services Social Determinants of Health Reivew: SDOH reviewed no interventions necessary Readmission risk has been reviewed: No Transition of care needs: no transition of care needs at this time

## 2023-03-24 NOTE — Progress Notes (Addendum)
PROGRESS NOTE    Marvin Miller  ZOX:[id number] DOB: July 31, 1996 DOA: 03/22/2023 PCP: Patient, No Pcp Per   Brief Narrative:  27 year old male with no past medical history presented with worsening shortness of breath, cough and dyspnea for few weeks.  On presentation, he was slightly intermittently tachycardic but required no supplemental oxygen.  CTA chest was negative for PE but showed groundglass patchy areas of airspace disease throughout both lungs.  Started on Rocephin and Zithromax.  He tested positive for HIV antibody.  ID was consulted.  Assessment & Plan:   Possible community-acquired bilateral bacterial pneumonia -Presented with worsening shortness breath, cough and dyspnea for few weeks.  Imaging as above. -Procalcitonin less than one 0.1.  COVID-19 and respiratory virus panel PCR negative. -Currently on Rocephin and Zithromax.  Continue the same. -Currently on room air  -Still feels short of breath with exertion.  - Blood cultures negative so far  Possible new diagnosis of HIV -Positive HIV antibody test.  ID following.  Patient has been started on Biktarvy.  HIV RNA quant, CD4 pending. -Unclear if the patient has PJP pneumonia  Hyponatremia -Mild.  Monitor.  Encourage oral intake  Hypokalemia -Improved  Obesity -Outpatient follow-up  Normocytic anemia--questionable cause.  Hemoglobin stable.  Monitor intermittently.    DVT prophylaxis: Lovenox Code Status: Full Family Communication: None at bedside Disposition Plan: Status is: inpatient because: Of severity of illness.  Need for IV antibiotics.  Consultants: ID  Procedures: None  Antimicrobials: Rocephin and Zithromax from 03/22/2023 onwards Biktarvy from 03/23/2023 onwards    Subjective: Patient seen and examined at bedside.  Still short of breath with intermittent cough.  Has not felt any better since admission.  No fever or vomiting reported. Objective: Vitals:   03/23/23 1928 03/23/23 2000  03/24/23 0411 03/24/23 0700  BP: 137/88  126/81 (!) 141/83  Pulse: (!) 122 (!) 116 (!) 111 (!) 126  Resp:    18  Temp: 99.4 F (37.4 C)  99.1 F (37.3 C) 98.3 F (36.8 C)  TempSrc: Oral  Oral Oral  SpO2: 95% 95% 93% 92%  Weight:      Height:        Intake/Output Summary (Last 24 hours) at 03/24/2023 1056 Last data filed at 03/24/2023 0937 Gross per 24 hour  Intake 480 ml  Output --  Net 480 ml   Filed Weights   03/22/23 1128  Weight: 101 kg    Examination:  General: On room air.  No distress ENT/neck: No thyromegaly.  JVD is not elevated  respiratory: Decreased breath sounds at bases bilaterally with some crackles; no wheezing  CVS: S1-S2 heard, intermittently tachycardia present  abdominal: Soft, obese, nontender, slightly distended; no organomegaly, bowel sounds are heard Extremities: Trace lower extremity edema; no cyanosis  CNS: Awake and alert.  No focal neurologic deficit.  Moves extremities Lymph: No obvious lymphadenopathy Skin: No obvious ecchymosis/lesions  psych: Affect, judgment and mood are normal  musculoskeletal: No obvious joint swelling/deformity     Data Reviewed: I have personally reviewed following labs and imaging studies  CBC: Recent Labs  Lab 03/22/23 1133 03/23/23 0601 03/24/23 0218  WBC 5.5 4.7 5.0  NEUTROABS  --  3.6 4.0  HGB 14.1 12.9* 12.3*  HCT 40.3 36.2* 35.4*  MCV 85.2 83.6 84.7  PLT 252 PLATELET CLUMPS NOTED ON SMEAR, UNABLE TO ESTIMATE 238   Basic Metabolic Panel: Recent Labs  Lab 03/22/23 1133 03/24/23 0218  NA 133* 132*  K 3.3* 3.5  CL 97*  97*  CO2 23 24  GLUCOSE 119* 110*  BUN 5* <5*  CREATININE 0.73 0.70  CALCIUM 8.7* 8.6*  MG  --  1.7   GFR: Estimated Creatinine Clearance: 159.7 mL/min (by C-G formula based on SCr of 0.7 mg/dL). Liver Function Tests: Recent Labs  Lab 03/24/23 0218  AST 20  ALT 17  ALKPHOS 29*  BILITOT 0.5  PROT 6.0*  ALBUMIN 2.8*   No results for input(s): "LIPASE", "AMYLASE" in  the last 168 hours. No results for input(s): "AMMONIA" in the last 168 hours. Coagulation Profile: No results for input(s): "INR", "PROTIME" in the last 168 hours. Cardiac Enzymes: No results for input(s): "CKTOTAL", "CKMB", "CKMBINDEX", "TROPONINI" in the last 168 hours. BNP (last 3 results) No results for input(s): "PROBNP" in the last 8760 hours. HbA1C: No results for input(s): "HGBA1C" in the last 72 hours. CBG: No results for input(s): "GLUCAP" in the last 168 hours. Lipid Profile: No results for input(s): "CHOL", "HDL", "LDLCALC", "TRIG", "CHOLHDL", "LDLDIRECT" in the last 72 hours. Thyroid Function Tests: No results for input(s): "TSH", "T4TOTAL", "FREET4", "T3FREE", "THYROIDAB" in the last 72 hours. Anemia Panel: No results for input(s): "VITAMINB12", "FOLATE", "FERRITIN", "TIBC", "IRON", "RETICCTPCT" in the last 72 hours. Sepsis Labs: Recent Labs  Lab 03/22/23 1549 03/22/23 2209  PROCALCITON  --  <0.10  LATICACIDVEN 1.6  --     Recent Results (from the past 240 hour(s))  SARS Coronavirus 2 by RT PCR (hospital order, performed in [facility] hospital lab) *cepheid single result test* Anterior Nasal Swab     Status: None   Collection Time: 03/22/23 11:31 AM   Specimen: Anterior Nasal Swab  Result Value Ref Range Status   SARS Coronavirus 2 by RT PCR NEGATIVE NEGATIVE Final    Comment: Performed at [facility], [address], [place], [place] [address]  Blood culture (routine x 2)     Status: None (Preliminary result)   Collection Time: 03/22/23  3:46 PM   Specimen: BLOOD  Result Value Ref Range Status   Specimen Description BLOOD LEFT ANTECUBITAL  Final   Special Requests   Final    BOTTLES DRAWN AEROBIC AND ANAEROBIC Blood Culture adequate volume   Culture   Final    NO GROWTH 2 DAYS Performed at [facility], [address] [place], [place] [address]    Report Status PENDING  Incomplete  Blood culture (routine x 2)     Status: None (Preliminary  result)   Collection Time: 03/22/23  3:46 PM   Specimen: BLOOD  Result Value Ref Range Status   Specimen Description BLOOD RIGHT ANTECUBITAL  Final   Special Requests   Final    BOTTLES DRAWN AEROBIC AND ANAEROBIC Blood Culture adequate volume   Culture   Final    NO GROWTH 2 DAYS Performed at [facility], [address], [place], [place] [address]    Report Status PENDING  Incomplete  Respiratory (~20 pathogens) panel by PCR     Status: None   Collection Time: 03/22/23  8:04 PM   Specimen: Respiratory  Result Value Ref Range Status   Adenovirus NOT DETECTED NOT DETECTED Final   Coronavirus 229E NOT DETECTED NOT DETECTED Final    Comment: (NOTE) The Coronavirus on the Respiratory Panel, DOES NOT test for the novel  Coronavirus (2019 nCoV)    Coronavirus HKU1 NOT DETECTED NOT DETECTED Final   Coronavirus NL63 NOT DETECTED NOT DETECTED Final   Coronavirus OC43 NOT DETECTED  NOT DETECTED Final   Metapneumovirus NOT DETECTED NOT DETECTED Final   Rhinovirus / Enterovirus NOT DETECTED NOT DETECTED Final   Influenza A NOT DETECTED NOT DETECTED Final   Influenza B NOT DETECTED NOT DETECTED Final   Parainfluenza Virus 1 NOT DETECTED NOT DETECTED Final   Parainfluenza Virus 2 NOT DETECTED NOT DETECTED Final   Parainfluenza Virus 3 NOT DETECTED NOT DETECTED Final   Parainfluenza Virus 4 NOT DETECTED NOT DETECTED Final   Respiratory Syncytial Virus NOT DETECTED NOT DETECTED Final   Bordetella pertussis NOT DETECTED NOT DETECTED Final   Bordetella Parapertussis NOT DETECTED NOT DETECTED Final   Chlamydophila pneumoniae NOT DETECTED NOT DETECTED Final   Mycoplasma pneumoniae NOT DETECTED NOT DETECTED Final    Comment: Performed at [facility], [address], [place], [place] [address]         Radiology Studies: CT Angio Chest PE W/Cm &/Or Wo Cm  Result Date: 03/22/2023 CLINICAL DATA:  Worsening shortness of breath for 2 weeks. Cough. High probability for pulmonary  embolism. EXAM: CT ANGIOGRAPHY CHEST WITH CONTRAST TECHNIQUE: Multidetector CT imaging of the chest was performed using the standard protocol during bolus administration of intravenous contrast. Multiplanar CT image reconstructions and MIPs were obtained to evaluate the vascular anatomy. RADIATION DOSE REDUCTION: This exam was performed according to the departmental dose-optimization program which includes automated exposure control, adjustment of the mA and/or kV according to patient size and/or use of iterative reconstruction technique. CONTRAST:  75mL OMNIPAQUE IOHEXOL 350 MG/ML SOLN COMPARISON:  None Available. FINDINGS: Cardiovascular: Satisfactory opacification of pulmonary arteries noted, and no pulmonary emboli identified. No evidence of thoracic aortic dissection or aneurysm. Mediastinum/Nodes: No masses or pathologically enlarged lymph nodes identified. Lungs/Pleura: Heterogeneous ground-glass opacity and patchy areas of airspace disease are seen throughout both lungs. Broad differential diagnosis includes atypical infectious and inflammatory etiologies, and inhalational injury. No evidence of pulmonary mass or pleural effusion. Upper abdomen: No acute findings. Musculoskeletal: No suspicious bone lesions identified. Review of the MIP images confirms the above findings. IMPRESSION: No evidence of pulmonary embolism. Heterogeneous ground-glass opacity and patchy areas of airspace disease throughout both lungs. Broad differential diagnosis includes atypical infectious and inflammatory etiologies, and inhalational injury. Electronically Signed   By: Danae Orleans M.D.   On: 03/22/2023 17:05   DG Chest 2 View  Result Date: 03/22/2023 CLINICAL DATA:  27 year old with shortness of breath. EXAM: CHEST - 2 VIEW COMPARISON:  12/02/2015 FINDINGS: Two views of the chest demonstrate low lung volumes. Slightly prominent lung markings may be associated with the low lung volumes. Linear densities in the perihilar  regions. No pleural effusions. Heart size is normal. Trachea is midline. Negative for a pneumothorax. IMPRESSION: Low lung volumes with bilateral atelectasis and prominent lung markings. Cannot exclude atypical infection. Electronically Signed   By: Richarda Overlie M.D.   On: 03/22/2023 12:21     Glade Lloyd, MD [facility] 03/24/2023, 10:56 AM

## 2023-03-24 NOTE — Telephone Encounter (Signed)
Patient Advocate Encounter  Completed and sent [employer] Access application for Biktarvy for this patient .      BIN      [id number] PCN    ACCESS GRP    [id number] ID        [id number]     Roland Earl, CPhT Pharmacy Patient Advocate Specialist [facility] Patient Advocate Team Direct Number: [phone] Fax: [phone]

## 2023-03-25 ENCOUNTER — Other Ambulatory Visit (HOSPITAL_COMMUNITY): Payer: Self-pay

## 2023-03-25 DIAGNOSIS — B59 Pneumocystosis: Secondary | ICD-10-CM

## 2023-03-25 DIAGNOSIS — B20 Human immunodeficiency virus [HIV] disease: Secondary | ICD-10-CM | POA: Diagnosis not present

## 2023-03-25 DIAGNOSIS — J45909 Unspecified asthma, uncomplicated: Secondary | ICD-10-CM | POA: Diagnosis not present

## 2023-03-25 MED ORDER — SODIUM CHLORIDE 0.9 % IV SOLN: INTRAVENOUS | Status: AC

## 2023-03-25 MED ORDER — BIKTARVY 50-200-25 MG PO TABS
1.0000 | ORAL_TABLET | Freq: Every day | ORAL | 1 refills | Status: DC
  Filled 2023-03-25: qty 30, 30d supply, fill #0

## 2023-03-25 NOTE — Plan of Care (Signed)

## 2023-03-25 NOTE — Progress Notes (Signed)
Subjective:  Patient feels better after having been started on supplemental oxygen overnight.    Antibiotics:  Anti-infectives (From admission, onward)    Start     Dose/Rate Route Frequency Ordered Stop   04/14/23 1000  sulfamethoxazole-trimethoprim (BACTRIM DS) 800-160 MG per tablet 1 tablet       Placed in "Followed by" Linked Group   1 tablet Oral Daily 03/24/23 1013 05/05/23 0959   03/25/23 0000  bictegravir-emtricitabine-tenofovir AF (BIKTARVY) 50-200-25 MG TABS tablet        1 tablet Oral Daily 03/25/23 1133     03/24/23 1100  sulfamethoxazole-trimethoprim (BACTRIM DS) 800-160 MG per tablet 2 tablet       Placed in "Followed by" Linked Group   2 tablet Oral 3 times daily 03/24/23 1013 04/14/23 0959   03/23/23 1600  cefTRIAXone (ROCEPHIN) 2 g in sodium chloride 0.9 % 100 mL IVPB  Status:  Discontinued        2 g 200 mL/hr over 30 Minutes Intravenous Every 24 hours 03/22/23 1927 03/22/23 2010   03/23/23 1315  bictegravir-emtricitabine-tenofovir AF (BIKTARVY) 50-200-25 MG per tablet 1 tablet        1 tablet Oral Daily 03/23/23 1228     03/23/23 1000  azithromycin (ZITHROMAX) tablet 500 mg  Status:  Discontinued        500 mg Oral Daily 03/22/23 1927 03/24/23 1013   03/23/23 0806  cefTRIAXone (ROCEPHIN) 1 g in sodium chloride 0.9 % 100 mL IVPB  Status:  Discontinued        1 g 200 mL/hr over 30 Minutes Intravenous Every 24 hours 03/23/23 0806 03/24/23 1013   03/22/23 1515  cefTRIAXone (ROCEPHIN) 1 g in sodium chloride 0.9 % 100 mL IVPB        1 g 200 mL/hr over 30 Minutes Intravenous  Once 03/22/23 1503 03/22/23 1649   03/22/23 1515  azithromycin (ZITHROMAX) 500 mg in sodium chloride 0.9 % 250 mL IVPB        500 mg 250 mL/hr over 60 Minutes Intravenous  Once 03/22/23 1503 03/22/23 1805       Medications: Scheduled Meds:  bictegravir-emtricitabine-tenofovir AF  1 tablet Oral Daily   enoxaparin (LOVENOX) injection  40 mg Subcutaneous Q24H   predniSONE  40 mg  Oral BID WC   Followed by   Melene Muller ON 03/29/2023] predniSONE  40 mg Oral Q breakfast   Followed by   Melene Muller ON 04/03/2023] predniSONE  20 mg Oral Q breakfast   sodium chloride flush  3 mL Intravenous Q12H   sulfamethoxazole-trimethoprim  2 tablet Oral TID   Followed by   Melene Muller ON 04/14/2023] sulfamethoxazole-trimethoprim  1 tablet Oral Daily   Continuous Infusions:  sodium chloride     sodium chloride 100 mL/hr at 03/25/23 1051   PRN Meds:.sodium chloride, acetaminophen, sodium chloride flush    Objective: Weight change:   Intake/Output Summary (Last 24 hours) at 03/25/2023 1820 Last data filed at 03/25/2023 1051 Gross per 24 hour  Intake 240 ml  Output --  Net 240 ml   Blood pressure 129/89, pulse (!) 106, temperature 98 F (36.7 C), temperature source Oral, resp. rate 18, height 5\' 8"  (1.727 m), weight 101 kg, SpO2 96%. Temp:  [97.8 F (36.6 C)-98.1 F (36.7 C)] 98 F (36.7 C) (08/14 1420) Pulse Rate:  [96-106] 106 (08/14 1420) Resp:  [17-19] 18 (08/14 1420) BP: (129-143)/(80-98) 129/89 (08/14 1420) SpO2:  [94 %-96 %] 96 % (08/14 1420)  Physical Exam: Physical Exam Constitutional:      Appearance: He is well-developed.  HENT:     Head: Normocephalic and atraumatic.  Eyes:     Conjunctiva/sclera: Conjunctivae normal.  Cardiovascular:     Rate and Rhythm: Normal rate and regular rhythm.  Pulmonary:     Effort: Pulmonary effort is normal. No respiratory distress.     Breath sounds: Normal breath sounds. No stridor. No wheezing.  Abdominal:     General: There is no distension.     Palpations: Abdomen is soft.  Musculoskeletal:        General: Normal range of motion.     Cervical back: Normal range of motion and neck supple.  Skin:    General: Skin is warm and dry.     Findings: No erythema or rash.  Neurological:     General: No focal deficit present.     Mental Status: He is alert and oriented to person, place, and time.  Psychiatric:        Mood and Affect:  Mood normal.        Behavior: Behavior normal.        Thought Content: Thought content normal.        Judgment: Judgment normal.      CBC:    BMET Recent Labs    03/24/23 0218 03/25/23 0040  NA 132* 132*  K 3.5 3.7  CL 97* 96*  CO2 24 19*  GLUCOSE 110* 112*  BUN <5* 7  CREATININE 0.70 0.86  CALCIUM 8.6* 9.4     Liver Panel  Recent Labs    03/24/23 0218  PROT 6.0*  ALBUMIN 2.8*  AST 20  ALT 17  ALKPHOS 29*  BILITOT 0.5       Sedimentation Rate No results for input(s): "ESRSEDRATE" in the last 72 hours. C-Reactive Protein No results for input(s): "CRP" in the last 72 hours.  Micro Results: Recent Results (from the past 720 hour(s))  SARS Coronavirus 2 by RT PCR (hospital order, performed in [facility] hospital lab) *cepheid single result test* Anterior Nasal Swab     Status: None   Collection Time: 03/22/23 11:31 AM   Specimen: Anterior Nasal Swab  Result Value Ref Range Status   SARS Coronavirus 2 by RT PCR NEGATIVE NEGATIVE Final    Comment: Performed at [facility], [address] [place], [place] [address]  Blood culture (routine x 2)     Status: None (Preliminary result)   Collection Time: 03/22/23  3:46 PM   Specimen: BLOOD  Result Value Ref Range Status   Specimen Description BLOOD LEFT ANTECUBITAL  Final   Special Requests   Final    BOTTLES DRAWN AEROBIC AND ANAEROBIC Blood Culture adequate volume   Culture   Final    NO GROWTH 3 DAYS Performed at [facility], [address], [place], [place] [address]    Report Status PENDING  Incomplete  Blood culture (routine x 2)     Status: None (Preliminary result)   Collection Time: 03/22/23  3:46 PM   Specimen: BLOOD  Result Value Ref Range Status   Specimen Description BLOOD RIGHT ANTECUBITAL  Final   Special Requests   Final    BOTTLES DRAWN AEROBIC AND ANAEROBIC Blood Culture adequate volume   Culture   Final    NO GROWTH 3 DAYS Performed at [facility], [address] [place], [place] [address]    Report Status PENDING  Incomplete  Respiratory (~20 pathogens) panel by PCR     Status: None   Collection Time: 03/22/23  8:04 PM   Specimen: Respiratory  Result Value Ref Range Status   Adenovirus NOT DETECTED NOT DETECTED Final   Coronavirus 229E NOT DETECTED NOT DETECTED Final    Comment: (NOTE) The Coronavirus on the Respiratory Panel, DOES NOT test for the novel  Coronavirus (2019 nCoV)    Coronavirus HKU1 NOT DETECTED NOT DETECTED Final   Coronavirus NL63 NOT DETECTED NOT DETECTED Final   Coronavirus OC43 NOT DETECTED NOT DETECTED Final   Metapneumovirus NOT DETECTED NOT DETECTED Final   Rhinovirus / Enterovirus NOT DETECTED NOT DETECTED Final   Influenza A NOT DETECTED NOT DETECTED Final   Influenza B NOT DETECTED NOT DETECTED Final   Parainfluenza Virus 1 NOT DETECTED NOT DETECTED Final   Parainfluenza Virus 2 NOT DETECTED NOT DETECTED Final   Parainfluenza Virus 3 NOT DETECTED NOT DETECTED Final   Parainfluenza Virus 4 NOT DETECTED NOT DETECTED Final   Respiratory Syncytial Virus NOT DETECTED NOT DETECTED Final   Bordetella pertussis NOT DETECTED NOT DETECTED Final   Bordetella Parapertussis NOT DETECTED NOT DETECTED Final   Chlamydophila pneumoniae NOT DETECTED NOT DETECTED Final   Mycoplasma pneumoniae NOT DETECTED NOT DETECTED Final    Comment: Performed at [facility], [address], [place], [place] [address]    Studies/Results: No results found.    Assessment/Plan:  INTERVAL HISTORY: BMP stable   Principal Problem:   PCP (pneumocystis carinii pneumonia) (HCC) Active Problems:   CAP (community acquired pneumonia)   Pneumonia   AIDS (acquired immune deficiency syndrome) (HCC)   Uncomplicated asthma    Marvin Miller is a 27 y.o. male with newly diagnosed HIV/AIDS and likely CP pneumonia.  #1 Likely PCP pneumonia:  Will to treat presumptively for PCP pneumonia with 2 double strength Bactrim tablets 3 times  daily and prednisone taper  BMP ok overnight on high dose bactrim though he is also getting IV fluids  Serologies and antigen tests are pending.  If he fails to improve on empiric therapy for PCP pneumonia he will need a bronchoscopy with BAL for AFB cultures as well as fungal cultures.  The fact that he was in [facility] before would raise the possibility of coccidiomycosis being a pathogen here  Started ARV  Remains to be seen whether he might need to go home with oxygen.  Hopefully he will improve in the hospital sufficiently where he will not need home oxygen   #2 HIV/AIDS  CD4 nadir is <35  Continue  Biktarvy  If he takes a long time to reconstitute his immune system could consider the addition of Rukobia but much further down the line  I have personally spent 50 minutes involved in face-to-face and non-face-to-face activities for this patient on the day of the visit. Professional time spent includes the following activities: Preparing to see the patient (review of tests), Obtaining and/or reviewing separately obtained history (admission/discharge record), Performing a medically appropriate examination and/or evaluation , Ordering medications/tests/procedures, referring and communicating with other health care professionals, Documenting clinical information in the EMR, Independently interpreting results (not separately reported), Communicating results to the patient/family/caregiver, Counseling and educating the patient/family/caregiver and Care coordination (not separately reported).     Dana I Lohan has an appointment on September 4 at 945 with Dr. Daiva Eves at  [facility], which  is located in the [facility] at  [address] [employer] in [place].  [address], which is located to the left of the elevators.  Phone: [phone]  Fax: [phone]  [email or web address]  The patient should arrive 30 minutes  prior to their appoitment.     LOS: 2 days   Acey Lav 03/25/2023, 6:20 PM

## 2023-03-25 NOTE — Progress Notes (Signed)
[facility] PROGRESS NOTE    Progress Note  Marvin Miller  WUJ:[id number] DOB: October 15, 1995 DOA: 03/22/2023 PCP: Patient, No Pcp Per     Brief Narrative:   Marvin Miller is an 27 y.o. male no significant past medical history comes in for shortness of breath cough and wheezing slightly tachycardic CTA was negative for PE but it did showed groundglass opacities start empirically on Rocephin and azithromycin tested positive for HIV ID was consulted.   Assessment/Plan:   PCP (pneumocystis carinii pneumonia) (HCC) SARS-CoV-2 and respiratory virus panel. Discontinue IV Rocephin and azithromycin. CD4 count less than 35 Blood cultures have been negative till date. ID was consulted and agreed this probably PCP pneumonia agree with starting Bactrim 3 times a day and up prednisone taper. Patient still having trouble walking as he feels soft had coughing and dyspnea. Hopefully home in 24 to 48 hours. Had to replace on oxygen overnight due to dyspnea and tachycardia. Saturations greater 90% on 2 L of oxygen.  Newly diagnose AIDS: Patient was started on Biktarvy.  Hyponatremia: Of unclear etiology question SIADH in the setting of pulmonary involvement go ahead and challenge him with IV fluids recheck basic metabolic panel in the morning. If sodium worsen probably SIADH if improves it is likely hypovolemic.  Hypokalemia: Repleted now resolved.  Obesity: Noted.    DVT prophylaxis: lovenox Family Communication:none Status is: Inpatient Remains inpatient appropriate because: HIV and SOb    Code Status:     Code Status Orders  (From admission, onward)           Start     Ordered   03/22/23 1926  Full code  Continuous       Question:  By:  Answer:  Consent: discussion documented in EHR   03/22/23 1927           Code Status History     This patient has a current code status but no historical code status.         IV Access:   Peripheral  IV   Procedures and diagnostic studies:   No results found.   Medical Consultants:   None.   Subjective:    Marvin Miller still SOB  Objective:    Vitals:   03/24/23 1424 03/24/23 1923 03/25/23 0335 03/25/23 0741  BP: 132/79 (!) 143/98 (!) 138/98 135/80  Pulse: (!) 105  96 99  Resp: 17 18 17 19   Temp: 99.8 F (37.7 C) 97.8 F (36.6 C) 98 F (36.7 C) 98.1 F (36.7 C)  TempSrc: Oral Oral    SpO2: 93% 96% 95% 94%  Weight:      Height:       SpO2: 94 %   Intake/Output Summary (Last 24 hours) at 03/25/2023 0850 Last data filed at 03/24/2023 1500 Gross per 24 hour  Intake 480 ml  Output --  Net 480 ml   Filed Weights   03/22/23 1128  Weight: 101 kg    Exam: General exam: In no acute distress. Respiratory system: Good air movement and mild diffuse crackles. Cardiovascular system: S1 & S2 heard, RRR. No JVD. Gastrointestinal system: Abdomen is nondistended, soft and nontender.  Extremities: No pedal edema. Skin: No rashes, lesions or ulcers Psychiatry: Judgement and insight appear normal. Mood & affect appropriate.    Data Reviewed:    Labs: Basic Metabolic Panel: Recent Labs  Lab 03/22/23 1133 03/24/23 0218 03/25/23 0040  NA 133* 132* 132*  K 3.3* 3.5 3.7  CL 97*  97* 96*  CO2 23 24 19*  GLUCOSE 119* 110* 112*  BUN 5* <5* 7  CREATININE 0.73 0.70 0.86  CALCIUM 8.7* 8.6* 9.4  MG  --  1.7 1.8   GFR Estimated Creatinine Clearance: 148.5 mL/min (by C-G formula based on SCr of 0.86 mg/dL). Liver Function Tests: Recent Labs  Lab 03/24/23 0218  AST 20  ALT 17  ALKPHOS 29*  BILITOT 0.5  PROT 6.0*  ALBUMIN 2.8*   No results for input(s): "LIPASE", "AMYLASE" in the last 168 hours. No results for input(s): "AMMONIA" in the last 168 hours. Coagulation profile No results for input(s): "INR", "PROTIME" in the last 168 hours. COVID-19 Labs  Recent Labs    03/22/23 1349 03/23/23 1229  DDIMER <0.27  --   LDH  --  266*    Lab Results   Component Value Date   SARSCOV2NAA NEGATIVE 03/22/2023   SARSCOV2NAA NEGATIVE 01/10/2020    CBC: Recent Labs  Lab 03/22/23 1133 03/23/23 0601 03/24/23 0218  WBC 5.5 4.7 5.0  NEUTROABS  --  3.6 4.0  HGB 14.1 12.9* 12.3*  HCT 40.3 36.2* 35.4*  MCV 85.2 83.6 84.7  PLT 252 PLATELET CLUMPS NOTED ON SMEAR, UNABLE TO ESTIMATE 238   Cardiac Enzymes: No results for input(s): "CKTOTAL", "CKMB", "CKMBINDEX", "TROPONINI" in the last 168 hours. BNP (last 3 results) No results for input(s): "PROBNP" in the last 8760 hours. CBG: No results for input(s): "GLUCAP" in the last 168 hours. D-Dimer: Recent Labs    03/22/23 1349  DDIMER <0.27   Hgb A1c: No results for input(s): "HGBA1C" in the last 72 hours. Lipid Profile: No results for input(s): "CHOL", "HDL", "LDLCALC", "TRIG", "CHOLHDL", "LDLDIRECT" in the last 72 hours. Thyroid function studies: No results for input(s): "TSH", "T4TOTAL", "T3FREE", "THYROIDAB" in the last 72 hours.  Invalid input(s): "FREET3" Anemia work up: No results for input(s): "VITAMINB12", "FOLATE", "FERRITIN", "TIBC", "IRON", "RETICCTPCT" in the last 72 hours. Sepsis Labs: Recent Labs  Lab 03/22/23 1133 03/22/23 1549 03/22/23 2209 03/23/23 0601 03/24/23 0218  PROCALCITON  --   --  <0.10  --   --   WBC 5.5  --   --  4.7 5.0  LATICACIDVEN  --  1.6  --   --   --    Microbiology Recent Results (from the past 240 hour(s))  SARS Coronavirus 2 by RT PCR (hospital order, performed in [facility] hospital lab) *cepheid single result test* Anterior Nasal Swab     Status: None   Collection Time: 03/22/23 11:31 AM   Specimen: Anterior Nasal Swab  Result Value Ref Range Status   SARS Coronavirus 2 by RT PCR NEGATIVE NEGATIVE Final    Comment: Performed at [facility] Lab, [address], [place], [place] [address]  Blood culture (routine x 2)     Status: None (Preliminary result)   Collection Time: 03/22/23  3:46 PM   Specimen: BLOOD  Result Value Ref  Range Status   Specimen Description BLOOD LEFT ANTECUBITAL  Final   Special Requests   Final    BOTTLES DRAWN AEROBIC AND ANAEROBIC Blood Culture adequate volume   Culture   Final    NO GROWTH 3 DAYS Performed at [facility], [address], [place], [place] [address]    Report Status PENDING  Incomplete  Blood culture (routine x 2)     Status: None (Preliminary result)   Collection Time: 03/22/23  3:46 PM   Specimen: BLOOD  Result Value Ref  Range Status   Specimen Description BLOOD RIGHT ANTECUBITAL  Final   Special Requests   Final    BOTTLES DRAWN AEROBIC AND ANAEROBIC Blood Culture adequate volume   Culture   Final    NO GROWTH 3 DAYS Performed at [facility], [address], [place], [place] [address]    Report Status PENDING  Incomplete  Respiratory (~20 pathogens) panel by PCR     Status: None   Collection Time: 03/22/23  8:04 PM   Specimen: Respiratory  Result Value Ref Range Status   Adenovirus NOT DETECTED NOT DETECTED Final   Coronavirus 229E NOT DETECTED NOT DETECTED Final    Comment: (NOTE) The Coronavirus on the Respiratory Panel, DOES NOT test for the novel  Coronavirus (2019 nCoV)    Coronavirus HKU1 NOT DETECTED NOT DETECTED Final   Coronavirus NL63 NOT DETECTED NOT DETECTED Final   Coronavirus OC43 NOT DETECTED NOT DETECTED Final   Metapneumovirus NOT DETECTED NOT DETECTED Final   Rhinovirus / Enterovirus NOT DETECTED NOT DETECTED Final   Influenza A NOT DETECTED NOT DETECTED Final   Influenza B NOT DETECTED NOT DETECTED Final   Parainfluenza Virus 1 NOT DETECTED NOT DETECTED Final   Parainfluenza Virus 2 NOT DETECTED NOT DETECTED Final   Parainfluenza Virus 3 NOT DETECTED NOT DETECTED Final   Parainfluenza Virus 4 NOT DETECTED NOT DETECTED Final   Respiratory Syncytial Virus NOT DETECTED NOT DETECTED Final   Bordetella pertussis NOT DETECTED NOT DETECTED Final   Bordetella Parapertussis NOT DETECTED NOT DETECTED Final   Chlamydophila  pneumoniae NOT DETECTED NOT DETECTED Final   Mycoplasma pneumoniae NOT DETECTED NOT DETECTED Final    Comment: Performed at [facility], [address], [place], [place] [address]     Medications:    bictegravir-emtricitabine-tenofovir AF  1 tablet Oral Daily   enoxaparin (LOVENOX) injection  40 mg Subcutaneous Q24H   predniSONE  40 mg Oral BID WC   Followed by   Melene Muller ON 03/29/2023] predniSONE  40 mg Oral Q breakfast   Followed by   Melene Muller ON 04/03/2023] predniSONE  20 mg Oral Q breakfast   sodium chloride flush  3 mL Intravenous Q12H   sulfamethoxazole-trimethoprim  2 tablet Oral TID   Followed by   Melene Muller ON 04/14/2023] sulfamethoxazole-trimethoprim  1 tablet Oral Daily   Continuous Infusions:  sodium chloride        LOS: 2 days   Marinda Elk  [facility]  03/25/2023, 8:50 AM

## 2023-03-25 NOTE — Progress Notes (Signed)
Mobility Specialist Progress Note:   03/25/23 1100  Mobility  Activity Ambulated independently in hallway;Ambulated with assistance in hallway  Level of Assistance Modified independent, requires aide device or extra time  Assistive Device None  Distance Ambulated (ft) 550 ft  Activity Response Tolerated well  Mobility Referral Yes  $Mobility charge 1 Mobility  Mobility Specialist Start Time (ACUTE ONLY) 1100  Mobility Specialist Stop Time (ACUTE ONLY) 1120  Mobility Specialist Time Calculation (min) (ACUTE ONLY) 20 min   Pre Mobility: SpO2 90% 1LO2 During Mobility: SpO2 84% RA                                     89% 2LO2  Pt agreeable to mobility session. Required no physical assistance throughout ambulation. 2LO2 required to maintain SpO2 WFL. Pt c/o SOB with exertion, no other complaints. Back in bed with all needs met, encouraged frequent ambulation. Pt and mother verbalized understanding.  Addison Lank Mobility Specialist Please contact via SecureChat or  Rehab office at [phone]

## 2023-03-26 DIAGNOSIS — J189 Pneumonia, unspecified organism: Secondary | ICD-10-CM | POA: Diagnosis not present

## 2023-03-26 DIAGNOSIS — J45909 Unspecified asthma, uncomplicated: Secondary | ICD-10-CM | POA: Diagnosis not present

## 2023-03-26 DIAGNOSIS — B59 Pneumocystosis: Secondary | ICD-10-CM | POA: Diagnosis not present

## 2023-03-26 DIAGNOSIS — B20 Human immunodeficiency virus [HIV] disease: Secondary | ICD-10-CM | POA: Diagnosis not present

## 2023-03-26 LAB — BASIC METABOLIC PANEL
Anion gap: 10 (ref 5–15)
BUN: 8 mg/dL (ref 6–20)
CO2: 22 mmol/L (ref 22–32)
Calcium: 8.7 mg/dL — ABNORMAL LOW (ref 8.9–10.3)
Chloride: 101 mmol/L (ref 98–111)
Creatinine, Ser: 1.09 mg/dL (ref 0.61–1.24)
GFR, Estimated: >60 mL/min (ref >60)
Glucose, Bld: 130 mg/dL — ABNORMAL HIGH (ref 70–99)
Potassium: 3.7 mmol/L (ref 3.5–5.1)
Sodium: 133 mmol/L — ABNORMAL LOW (ref 135–145)

## 2023-03-26 NOTE — Plan of Care (Signed)
?  Problem: Clinical Measurements: ?Goal: Respiratory complications will improve ?Outcome: Progressing ?  ?Problem: Activity: ?Goal: Risk for activity intolerance will decrease ?Outcome: Progressing ?  ?Problem: Nutrition: ?Goal: Adequate nutrition will be maintained ?Outcome: Progressing ?  ?Problem: Coping: ?Goal: Level of anxiety will decrease ?Outcome: Progressing ?  ?

## 2023-03-26 NOTE — Progress Notes (Signed)
Pharmacy: Antimicrobial Stewardship Note  23 YOM with new HIV diagnosis and likely PJP pneumonia. Recently started on Biktarvy for antiretroviral therapy. Biktarvy filled at [facility] with copay assistance card and delivered to bedside. Patient and family (mom) counseled on the medication and stressed compliance. The patient has 1 refill and a clinic appointment scheduled for 04/15/23 at 10:45 with Dr. Daiva Eves  Will wait to send scripts for sulfamethoxazole/trimethoprim  and steroids to [facility] until closer to discharge. The patient is aware that he will be on treatment dosing on sulfamethoxazole/trimethoprim for PJP and then drop down to daily dosing for PJP prophylaxis.  Thank you for allowing pharmacy to be a part of this patient's care.  Georgina Pillion, PharmD, BCPS, BCIDP Infectious Diseases Clinical Pharmacist 03/26/2023 11:39 AM   **Pharmacist phone directory can now be found on [email or web address] ([email or web address]).  Listed under [facility].

## 2023-03-26 NOTE — Progress Notes (Signed)
Subjective:  Patient is feeling better now no longer requiring oxygen    Antibiotics:  Anti-infectives (From admission, onward)    Start     Dose/Rate Route Frequency Ordered Stop   04/14/23 1000  sulfamethoxazole-trimethoprim (BACTRIM DS) 800-160 MG per tablet 1 tablet       Placed in "Followed by" Linked Group   1 tablet Oral Daily 03/24/23 1013 05/05/23 0959   03/25/23 0000  bictegravir-emtricitabine-tenofovir AF (BIKTARVY) 50-200-25 MG TABS tablet        1 tablet Oral Daily 03/25/23 1133     03/24/23 1100  sulfamethoxazole-trimethoprim (BACTRIM DS) 800-160 MG per tablet 2 tablet       Placed in "Followed by" Linked Group   2 tablet Oral 3 times daily 03/24/23 1013 04/14/23 0959   03/23/23 1600  cefTRIAXone (ROCEPHIN) 2 g in sodium chloride 0.9 % 100 mL IVPB  Status:  Discontinued        2 g 200 mL/hr over 30 Minutes Intravenous Every 24 hours 03/22/23 1927 03/22/23 2010   03/23/23 1315  bictegravir-emtricitabine-tenofovir AF (BIKTARVY) 50-200-25 MG per tablet 1 tablet        1 tablet Oral Daily 03/23/23 1228     03/23/23 1000  azithromycin (ZITHROMAX) tablet 500 mg  Status:  Discontinued        500 mg Oral Daily 03/22/23 1927 03/24/23 1013   03/23/23 0806  cefTRIAXone (ROCEPHIN) 1 g in sodium chloride 0.9 % 100 mL IVPB  Status:  Discontinued        1 g 200 mL/hr over 30 Minutes Intravenous Every 24 hours 03/23/23 0806 03/24/23 1013   03/22/23 1515  cefTRIAXone (ROCEPHIN) 1 g in sodium chloride 0.9 % 100 mL IVPB        1 g 200 mL/hr over 30 Minutes Intravenous  Once 03/22/23 1503 03/22/23 1649   03/22/23 1515  azithromycin (ZITHROMAX) 500 mg in sodium chloride 0.9 % 250 mL IVPB        500 mg 250 mL/hr over 60 Minutes Intravenous  Once 03/22/23 1503 03/22/23 1805       Medications: Scheduled Meds:  bictegravir-emtricitabine-tenofovir AF  1 tablet Oral Daily   enoxaparin (LOVENOX) injection  40 mg Subcutaneous Q24H   predniSONE  40 mg Oral BID WC   Followed  by   Melene Muller ON 03/29/2023] predniSONE  40 mg Oral Q breakfast   Followed by   Melene Muller ON 04/03/2023] predniSONE  20 mg Oral Q breakfast   sodium chloride flush  3 mL Intravenous Q12H   sulfamethoxazole-trimethoprim  2 tablet Oral TID   Followed by   Melene Muller ON 04/14/2023] sulfamethoxazole-trimethoprim  1 tablet Oral Daily   Continuous Infusions:  sodium chloride     PRN Meds:.sodium chloride, acetaminophen, sodium chloride flush    Objective: Weight change:  No intake or output data in the 24 hours ending 03/26/23 1534  Blood pressure 126/77, pulse 85, temperature 98.4 F (36.9 C), temperature source Oral, resp. rate 19, height 5\' 8"  (1.727 m), weight 101 kg, SpO2 99%. Temp:  [97.7 F (36.5 C)-98.7 F (37.1 C)] 98.4 F (36.9 C) (08/15 1400) Pulse Rate:  [85-102] 85 (08/15 0731) Resp:  [16-19] 19 (08/15 1400) BP: (116-142)/(73-93) 126/77 (08/15 1400) SpO2:  [96 %-99 %] 99 % (08/15 1400)  Physical Exam: Physical Exam Constitutional:      Appearance: He is well-developed.  HENT:     Head: Normocephalic and atraumatic.  Eyes:     Conjunctiva/sclera:  Conjunctivae normal.  Cardiovascular:     Rate and Rhythm: Normal rate and regular rhythm.  Pulmonary:     Effort: Pulmonary effort is normal. No respiratory distress.     Breath sounds: No stridor. No wheezing.  Abdominal:     General: There is no distension.     Palpations: Abdomen is soft.  Musculoskeletal:        General: Normal range of motion.     Cervical back: Normal range of motion and neck supple.  Skin:    General: Skin is warm and dry.     Findings: No erythema or rash.  Neurological:     General: No focal deficit present.     Mental Status: He is alert and oriented to person, place, and time.  Psychiatric:        Mood and Affect: Mood normal.        Behavior: Behavior normal.        Thought Content: Thought content normal.        Judgment: Judgment normal.      CBC:    BMET Recent Labs     03/25/23 0040 03/26/23 0219  NA 132* 133*  K 3.7 3.7  CL 96* 101  CO2 19* 22  GLUCOSE 112* 130*  BUN 7 8  CREATININE 0.86 1.09  CALCIUM 9.4 8.7*     Liver Panel  Recent Labs    03/24/23 0218  PROT 6.0*  ALBUMIN 2.8*  AST 20  ALT 17  ALKPHOS 29*  BILITOT 0.5       Sedimentation Rate No results for input(s): "ESRSEDRATE" in the last 72 hours. C-Reactive Protein No results for input(s): "CRP" in the last 72 hours.  Micro Results: Recent Results (from the past 720 hour(s))  SARS Coronavirus 2 by RT PCR (hospital order, performed in [facility] hospital lab) *cepheid single result test* Anterior Nasal Swab     Status: None   Collection Time: 03/22/23 11:31 AM   Specimen: Anterior Nasal Swab  Result Value Ref Range Status   SARS Coronavirus 2 by RT PCR NEGATIVE NEGATIVE Final    Comment: Performed at [facility], [address], [place], [place] [address]  Blood culture (routine x 2)     Status: None (Preliminary result)   Collection Time: 03/22/23  3:46 PM   Specimen: BLOOD  Result Value Ref Range Status   Specimen Description BLOOD LEFT ANTECUBITAL  Final   Special Requests   Final    BOTTLES DRAWN AEROBIC AND ANAEROBIC Blood Culture adequate volume   Culture   Final    NO GROWTH 4 DAYS Performed at [facility], [address], [place], [place] [address]    Report Status PENDING  Incomplete  Blood culture (routine x 2)     Status: None (Preliminary result)   Collection Time: 03/22/23  3:46 PM   Specimen: BLOOD  Result Value Ref Range Status   Specimen Description BLOOD RIGHT ANTECUBITAL  Final   Special Requests   Final    BOTTLES DRAWN AEROBIC AND ANAEROBIC Blood Culture adequate volume   Culture   Final    NO GROWTH 4 DAYS Performed at [facility], [address], [place], [place] [address]    Report Status PENDING  Incomplete  Respiratory (~20 pathogens) panel by PCR     Status: None   Collection Time: 03/22/23  8:04 PM    Specimen: Respiratory  Result Value Ref Range Status  Adenovirus NOT DETECTED NOT DETECTED Final   Coronavirus 229E NOT DETECTED NOT DETECTED Final    Comment: (NOTE) The Coronavirus on the Respiratory Panel, DOES NOT test for the novel  Coronavirus (2019 nCoV)    Coronavirus HKU1 NOT DETECTED NOT DETECTED Final   Coronavirus NL63 NOT DETECTED NOT DETECTED Final   Coronavirus OC43 NOT DETECTED NOT DETECTED Final   Metapneumovirus NOT DETECTED NOT DETECTED Final   Rhinovirus / Enterovirus NOT DETECTED NOT DETECTED Final   Influenza A NOT DETECTED NOT DETECTED Final   Influenza B NOT DETECTED NOT DETECTED Final   Parainfluenza Virus 1 NOT DETECTED NOT DETECTED Final   Parainfluenza Virus 2 NOT DETECTED NOT DETECTED Final   Parainfluenza Virus 3 NOT DETECTED NOT DETECTED Final   Parainfluenza Virus 4 NOT DETECTED NOT DETECTED Final   Respiratory Syncytial Virus NOT DETECTED NOT DETECTED Final   Bordetella pertussis NOT DETECTED NOT DETECTED Final   Bordetella Parapertussis NOT DETECTED NOT DETECTED Final   Chlamydophila pneumoniae NOT DETECTED NOT DETECTED Final   Mycoplasma pneumoniae NOT DETECTED NOT DETECTED Final    Comment: Performed at [facility], [address] [place], [place] [address]    Studies/Results: No results found.    Assessment/Plan:  INTERVAL HISTORY: BMP stable   Principal Problem:   PCP (pneumocystis carinii pneumonia) (HCC) Active Problems:   CAP (community acquired pneumonia)   Pneumonia   AIDS (acquired immune deficiency syndrome) (HCC)   Uncomplicated asthma    Marvin Miller is a 27 y.o. male with newly diagnosed HIV/AIDS and likely CP pneumonia.  #1 Likely PCP pneumonia:  Will to treat presumptively for PCP pneumonia with 2 double strength Bactrim tablets 3 times daily and prednisone taper    Started ARV  He would prefer to stay another 24 hours in the hospital prior to DC   #2 HIV/AIDS  CD4 nadir is <35  Continue   Biktarvy  If he takes a long time to reconstitute his immune system could consider the addition of Rukobia but much further down the line    Marvin Miller has an appointment on September 4 at 945 with Dr. Daiva Eves at  [facility], which  is located in the [facility] at  [address] in [place].  [address], which is located to the left of the elevators.  Phone: [phone]  Fax: [phone]  [email or web address]  The patient should arrive 30 minutes prior to their appoitment.  I have personally spent 52 minutes involved in face-to-face and non-face-to-face activities for this patient on the day of the visit. Professional time spent includes the following activities: Preparing to see the patient (review of tests), Obtaining and/or reviewing separately obtained history (admission/discharge record), Performing a medically appropriate examination and/or evaluation , Ordering medications/tests/procedures, referring and communicating with other health care professionals, Documenting clinical information in the EMR, Independently interpreting results (not separately reported), Communicating results to the patient/family/caregiver, Counseling and educating the patient/family/caregiver and Care coordination (not separately reported).     LOS: 3 days   Acey Lav 03/26/2023, 3:34 PM

## 2023-03-26 NOTE — Progress Notes (Signed)
[facility] PROGRESS NOTE    Progress Note  Marvin Miller  WNU:[id number] DOB: [birth year] DOA: 03/22/2023 PCP: Patient, No Pcp Per     Brief Narrative:   Marvin Miller is an 27 y.o. male no significant past medical history comes in for shortness of breath cough and wheezing slightly tachycardic CTA was negative for PE but it did showed groundglass opacities start empirically on Rocephin and azithromycin tested positive for HIV ID was consulted.   Assessment/Plan:   PCP (pneumocystis carinii pneumonia) (HCC) Acute hypoxic respiratory failure SARS-CoV-2 and respiratory virus panel negative; DC ceftriaxone/azithromycin CD4 count less than 35 Blood cultures remain negative. ID was consulted and agreed this probably PCP pneumonia ->Bactrim TID + prednisone taper Wean oxygen as tolerated (initiated overnight) - watch an additional 24-48h given hypoxia (now improving) with ongoing profound dyspnea even with minimal exertion.  Newly diagnose AIDS: Initiated on Biktarvy during this stay - follow up outpatient with ID as scheduled.  Hyponatremia: Of unclear etiology question SIADH in the setting of pulmonary involvement go ahead and challenge him with IV fluids recheck basic metabolic panel in the morning. If sodium worsen probably SIADH if improves it is likely hypovolemic.  Hypokalemia: Repleted now resolved.  Obesity: Noted.  DVT prophylaxis: lovenox   Code Status: Full Code  Family Communication: Mother updated over the phone at bedside with patient present Status is: Inpatient Remains inpatient appropriate because: HIV and SOb   IV Access:   Peripheral IV   Procedures and diagnostic studies:   No results found.   Medical Consultants:   None.   Subjective:    Leven I Beahm still SOB  Objective:    Vitals:   03/25/23 0741 03/25/23 1420 03/25/23 1951 03/26/23 0500  BP: 135/80 129/89 (!) 142/93 116/73  Pulse: 99 (!) 106 (!) 102 88  Resp: 19  18 18 16   Temp: 98.1 F (36.7 C) 98 F (36.7 C) 98.6 F (37 C) 97.7 F (36.5 C)  TempSrc:  Oral Oral Oral  SpO2: 94% 96% 96% 99%  Weight:      Height:       SpO2: 99 % O2 Flow Rate (L/min): 1 L/min   Intake/Output Summary (Last 24 hours) at 03/26/2023 0629 Last data filed at 03/25/2023 1051 Gross per 24 hour  Intake 240 ml  Output --  Net 240 ml   Filed Weights   03/22/23 1128  Weight: 101 kg    Exam: General exam: In no acute distress. Respiratory system: scattered rhonchi without over wheeze/rales Cardiovascular system: S1 & S2 heard, RRR. No JVD. Gastrointestinal system: Abdomen is nondistended, soft and nontender.  Extremities: No pedal edema. Skin: No rashes, lesions or ulcers Psychiatry: Judgement and insight appear normal. Mood & affect appropriate.    Data Reviewed:    Labs: Basic Metabolic Panel: Recent Labs  Lab 03/22/23 1133 03/24/23 0218 03/25/23 0040 03/26/23 0219  NA 133* 132* 132* 133*  K 3.3* 3.5 3.7 3.7  CL 97* 97* 96* 101  CO2 23 24 19* 22  GLUCOSE 119* 110* 112* 130*  BUN 5* <5* 7 8  CREATININE 0.73 0.70 0.86 1.09  CALCIUM 8.7* 8.6* 9.4 8.7*  MG  --  1.7 1.8  --    GFR Estimated Creatinine Clearance: 117.2 mL/min (by C-G formula based on SCr of 1.09 mg/dL). Liver Function Tests: Recent Labs  Lab 03/24/23 0218  AST 20  ALT 17  ALKPHOS 29*  BILITOT 0.5  PROT 6.0*  ALBUMIN 2.8*  CBC: Recent Labs  Lab 03/22/23 1133 03/23/23 0601 03/24/23 0218  WBC 5.5 4.7 5.0  NEUTROABS  --  3.6 4.0  HGB 14.1 12.9* 12.3*  HCT 40.3 36.2* 35.4*  MCV 85.2 83.6 84.7  PLT 252 PLATELET CLUMPS NOTED ON SMEAR, UNABLE TO ESTIMATE 238   Sepsis Labs: Recent Labs  Lab 03/22/23 1133 03/22/23 1549 03/22/23 2209 03/23/23 0601 03/24/23 0218  PROCALCITON  --   --  <0.10  --   --   WBC 5.5  --   --  4.7 5.0  LATICACIDVEN  --  1.6  --   --   --    Microbiology Recent Results (from the past 240 hour(s))  SARS Coronavirus 2 by RT PCR (hospital  order, performed in [facility] hospital lab) *cepheid single result test* Anterior Nasal Swab     Status: None   Collection Time: 03/22/23 11:31 AM   Specimen: Anterior Nasal Swab  Result Value Ref Range Status   SARS Coronavirus 2 by RT PCR NEGATIVE NEGATIVE Final    Comment: Performed at [facility], [address], [place], [place] [address]  Blood culture (routine x 2)     Status: None (Preliminary result)   Collection Time: 03/22/23  3:46 PM   Specimen: BLOOD  Result Value Ref Range Status   Specimen Description BLOOD LEFT ANTECUBITAL  Final   Special Requests   Final    BOTTLES DRAWN AEROBIC AND ANAEROBIC Blood Culture adequate volume   Culture   Final    NO GROWTH 3 DAYS Performed at [facility], [address] [place], [place] [address]    Report Status PENDING  Incomplete  Blood culture (routine x 2)     Status: None (Preliminary result)   Collection Time: 03/22/23  3:46 PM   Specimen: BLOOD  Result Value Ref Range Status   Specimen Description BLOOD RIGHT ANTECUBITAL  Final   Special Requests   Final    BOTTLES DRAWN AEROBIC AND ANAEROBIC Blood Culture adequate volume   Culture   Final    NO GROWTH 3 DAYS Performed at [facility], [address], [place], [place] [address]    Report Status PENDING  Incomplete  Respiratory (~20 pathogens) panel by PCR     Status: None   Collection Time: 03/22/23  8:04 PM   Specimen: Respiratory  Result Value Ref Range Status   Adenovirus NOT DETECTED NOT DETECTED Final   Coronavirus 229E NOT DETECTED NOT DETECTED Final    Comment: (NOTE) The Coronavirus on the Respiratory Panel, DOES NOT test for the novel  Coronavirus (2019 nCoV)    Coronavirus HKU1 NOT DETECTED NOT DETECTED Final   Coronavirus NL63 NOT DETECTED NOT DETECTED Final   Coronavirus OC43 NOT DETECTED NOT DETECTED Final   Metapneumovirus NOT DETECTED NOT DETECTED Final   Rhinovirus / Enterovirus NOT DETECTED NOT DETECTED Final   Influenza A NOT  DETECTED NOT DETECTED Final   Influenza B NOT DETECTED NOT DETECTED Final   Parainfluenza Virus 1 NOT DETECTED NOT DETECTED Final   Parainfluenza Virus 2 NOT DETECTED NOT DETECTED Final   Parainfluenza Virus 3 NOT DETECTED NOT DETECTED Final   Parainfluenza Virus 4 NOT DETECTED NOT DETECTED Final   Respiratory Syncytial Virus NOT DETECTED NOT DETECTED Final   Bordetella pertussis NOT DETECTED NOT DETECTED Final   Bordetella Parapertussis NOT DETECTED NOT DETECTED Final   Chlamydophila pneumoniae NOT DETECTED NOT DETECTED Final   Mycoplasma pneumoniae NOT DETECTED  NOT DETECTED Final    Comment: Performed at [facility], [address], [place], [place] [address]     Medications:    bictegravir-emtricitabine-tenofovir AF  1 tablet Oral Daily   enoxaparin (LOVENOX) injection  40 mg Subcutaneous Q24H   predniSONE  40 mg Oral BID WC   Followed by   Melene Muller ON 03/29/2023] predniSONE  40 mg Oral Q breakfast   Followed by   Melene Muller ON 04/03/2023] predniSONE  20 mg Oral Q breakfast   sodium chloride flush  3 mL Intravenous Q12H   sulfamethoxazole-trimethoprim  2 tablet Oral TID   Followed by   Melene Muller ON 04/14/2023] sulfamethoxazole-trimethoprim  1 tablet Oral Daily   Continuous Infusions:  sodium chloride     sodium chloride 100 mL/hr at 03/25/23 1051      LOS: 3 days   Azucena Fallen DO [facility]  03/26/2023, 6:29 AM

## 2023-03-26 NOTE — Progress Notes (Signed)
SATURATION QUALIFICATIONS: (This note is used to comply with regulatory documentation for home oxygen)  Patient Saturations on Room Air at Rest = 99%  Patient Saturations on Room Air while Ambulating = 94%  Patient Saturations on 0 Liters of oxygen while Ambulating =  Please briefly explain why patient needs home oxygen: No oxygen supplement needed while ambulating, pt. Able to maintain oxygen saturation above 94% room air

## 2023-03-27 ENCOUNTER — Other Ambulatory Visit (HOSPITAL_COMMUNITY): Payer: Self-pay

## 2023-03-27 DIAGNOSIS — B59 Pneumocystosis: Secondary | ICD-10-CM | POA: Diagnosis not present

## 2023-03-27 DIAGNOSIS — J45909 Unspecified asthma, uncomplicated: Secondary | ICD-10-CM | POA: Diagnosis not present

## 2023-03-27 DIAGNOSIS — Z6841 Body Mass Index (BMI) 40.0 and over, adult: Secondary | ICD-10-CM | POA: Diagnosis present

## 2023-03-27 DIAGNOSIS — B20 Human immunodeficiency virus [HIV] disease: Secondary | ICD-10-CM | POA: Diagnosis not present

## 2023-03-27 DIAGNOSIS — J189 Pneumonia, unspecified organism: Secondary | ICD-10-CM | POA: Diagnosis not present

## 2023-03-27 DIAGNOSIS — E669 Obesity, unspecified: Secondary | ICD-10-CM | POA: Diagnosis present

## 2023-03-27 LAB — BASIC METABOLIC PANEL
Anion gap: 9 (ref 5–15)
BUN: 9 mg/dL (ref 6–20)
CO2: 22 mmol/L (ref 22–32)
Calcium: 8.8 mg/dL — ABNORMAL LOW (ref 8.9–10.3)
Chloride: 103 mmol/L (ref 98–111)
Creatinine, Ser: 1.13 mg/dL (ref 0.61–1.24)
GFR, Estimated: >60 mL/min (ref >60)
Glucose, Bld: 109 mg/dL — ABNORMAL HIGH (ref 70–99)
Potassium: 4.2 mmol/L (ref 3.5–5.1)
Sodium: 134 mmol/L — ABNORMAL LOW (ref 135–145)

## 2023-03-27 MED ORDER — ALBUTEROL SULFATE HFA 108 (90 BASE) MCG/ACT IN AERS
2.0000 | INHALATION_SPRAY | Freq: Four times a day (QID) | RESPIRATORY_TRACT | 2 refills | Status: DC | PRN
  Filled 2023-03-27: qty 6.7, 25d supply, fill #0

## 2023-03-27 MED ORDER — SULFAMETHOXAZOLE-TRIMETHOPRIM 800-160 MG PO TABS: ORAL_TABLET | ORAL | 0 refills | Status: DC

## 2023-03-27 MED ORDER — SULFAMETHOXAZOLE-TRIMETHOPRIM 800-160 MG PO TABS
1.0000 | ORAL_TABLET | Freq: Every day | ORAL | 0 refills | Status: DC
Start: 2023-04-14 — End: 2023-04-15
  Filled 2023-03-27 (×2): qty 30, 30d supply, fill #0

## 2023-03-27 MED ORDER — SULFAMETHOXAZOLE-TRIMETHOPRIM 800-160 MG PO TABS
2.0000 | ORAL_TABLET | Freq: Three times a day (TID) | ORAL | 0 refills | Status: AC
Start: 2023-03-27 — End: 2023-04-13
  Filled 2023-03-27: qty 102, 17d supply, fill #0

## 2023-03-27 MED ORDER — PREDNISONE 20 MG PO TABS
ORAL_TABLET | ORAL | 0 refills | Status: AC
  Filled 2023-03-27: qty 29, 18d supply, fill #0

## 2023-03-27 NOTE — Discharge Summary (Addendum)
Physician Discharge Summary   Patient: Marvin Miller MRN: [id number] DOB: May 07, 1996  Admit date:     03/22/2023  Discharge date: 03/27/23  Discharge Physician: Bobette Mo   PCP: Patient, No Pcp Per   Recommendations at discharge:   Follow-up with Dr. Algis Liming as scheduled.  Discharge Diagnoses: Principal Problem:   PCP (pneumocystis carinii pneumonia) (HCC) Continue Bactrim p.o. 3 times daily for 3 weeks. Continue then Bactrim 1 p.o. daily. Received azithromycin and ceftriaxone earlier in this admission. Follow-up with infectious diseases in September.    AIDS (acquired immune deficiency syndrome) (HCC) Continue Biktarvy 1 tablet p.o. daily. Follow-up with Dr. Dollene Cleveland as scheduled in September.    Uncomplicated asthma Continue albuterol MDI as needed.    Hyponatremia Almost resolved after IVF challenge. SIADH ruled out.  Hospital Course: 27 year old male with no prior past medical history who came in to the emergency department 5 days ago with complaints of progressively worse dyspnea associated with clear sputum productive cough for about 2 weeks.  Patient was diagnosed with   Consultants: Infectious diseases open (Dr. Daiva Eves). Procedures performed: None. Disposition: Home Diet recommendation:  Regular diet DISCHARGE MEDICATION: Allergies as of 03/27/2023   No Known Allergies      Medication List     TAKE these medications    albuterol 108 (90 Base) MCG/ACT inhaler Commonly known as: VENTOLIN HFA Inhale 2 puffs into the lungs every 6 (six) hours as needed for wheezing or shortness of breath.   Biktarvy 50-200-25 MG Tabs tablet Generic drug: bictegravir-emtricitabine-tenofovir AF Take 1 tablet by mouth daily.   DayQuil Severe + VapoCool 5-10-200-325 MG/15ML Liqd Generic drug: Phenylephrine-DM-GG-APAP Take 15 mLs by mouth daily as needed (cough).   predniSONE 20 MG tablet Commonly known as: DELTASONE Take 2 tablets (40 mg total) by mouth 2  (two) times daily with a meal for 2 days, THEN 2 tablets (40 mg total) daily with breakfast for 5 days, THEN 1 tablet (20 mg total) daily with breakfast for 11 days. Start taking on: March 27, 2023   pseudoephedrine-guaifenesin 60-600 MG 12 hr tablet Commonly known as: MUCINEX D Take 1 tablet by mouth every 12 (twelve) hours.   sulfamethoxazole-trimethoprim 800-160 MG tablet Commonly known as: BACTRIM DS Take 2 tablets by mouth every 8 (eight) hours for 17 days.   sulfamethoxazole-trimethoprim 800-160 MG tablet Commonly known as: BACTRIM DS Take 2 tablets by mouth 3 (three) times daily for 21 days, THEN 1 tablet daily. Start taking on: March 27, 2023   sulfamethoxazole-trimethoprim 800-160 MG tablet Commonly known as: BACTRIM DS Take 1 tablet by mouth daily starting on 9/3 Start taking on: April 14, 2023     Follow-up Information     [facility] Follow up on 04/02/2023.   Why: Post hospital follow up scheduled for 04/02/2023 at 1:50 pm with Georgian Co PA Contact information: [address] [place] [address] [phone]        [facility]              Follow up on 04/15/2023.   Why: Hospital follow up with Infectious Disease scheduled for September 4th at 9:45 am with Dr. Daiva Eves. Please arrive 30 minutes prior to your appoitment time. Contact information: [address] [place] [address]               Discharge Exam: Ceasar Mons Weights   03/22/23 1128  Weight:  101 kg   Physical Exam Vitals and nursing note reviewed.  Constitutional:      Appearance: He is well-developed. He is obese.  HENT:     Head: Normocephalic.     Nose: No rhinorrhea.     Mouth/Throat:     Pharynx: No oropharyngeal exudate.  Eyes:     General: No scleral icterus.    Pupils: Pupils are equal, round, and reactive to light.  Neck:     Vascular: No JVD.   Cardiovascular:     Rate and Rhythm: Normal rate and regular rhythm.  Pulmonary:     Effort: Pulmonary effort is normal. No tachypnea or accessory muscle usage.     Breath sounds: Rales present. No decreased breath sounds, wheezing or rhonchi.  Musculoskeletal:     Cervical back: Neck supple.     Right lower leg: No edema.     Left lower leg: No edema.  Skin:    General: Skin is warm and dry.  Neurological:     General: No focal deficit present.     Mental Status: He is alert and oriented to person, place, and time.  Psychiatric:        Mood and Affect: Mood normal.        Behavior: Behavior normal.   Condition at discharge: good  The results of significant diagnostics from this hospitalization (including imaging, microbiology, ancillary and laboratory) are listed below for reference.   Imaging Studies: CT Angio Chest PE W/Cm &/Or Wo Cm  Result Date: 03/22/2023 CLINICAL DATA:  Worsening shortness of breath for 2 weeks. Cough. High probability for pulmonary embolism. EXAM: CT ANGIOGRAPHY CHEST WITH CONTRAST TECHNIQUE: Multidetector CT imaging of the chest was performed using the standard protocol during bolus administration of intravenous contrast. Multiplanar CT image reconstructions and MIPs were obtained to evaluate the vascular anatomy. RADIATION DOSE REDUCTION: This exam was performed according to the departmental dose-optimization program which includes automated exposure control, adjustment of the mA and/or kV according to patient size and/or use of iterative reconstruction technique. CONTRAST:  75mL OMNIPAQUE IOHEXOL 350 MG/ML SOLN COMPARISON:  None Available. FINDINGS: Cardiovascular: Satisfactory opacification of pulmonary arteries noted, and no pulmonary emboli identified. No evidence of thoracic aortic dissection or aneurysm. Mediastinum/Nodes: No masses or pathologically enlarged lymph nodes identified. Lungs/Pleura: Heterogeneous ground-glass opacity and patchy areas of  airspace disease are seen throughout both lungs. Broad differential diagnosis includes atypical infectious and inflammatory etiologies, and inhalational injury. No evidence of pulmonary mass or pleural effusion. Upper abdomen: No acute findings. Musculoskeletal: No suspicious bone lesions identified. Review of the MIP images confirms the above findings. IMPRESSION: No evidence of pulmonary embolism. Heterogeneous ground-glass opacity and patchy areas of airspace disease throughout both lungs. Broad differential diagnosis includes atypical infectious and inflammatory etiologies, and inhalational injury. Electronically Signed   By: Danae Orleans M.D.   On: 03/22/2023 17:05   DG Chest 2 View  Result Date: 03/22/2023 CLINICAL DATA:  27 year old with shortness of breath. EXAM: CHEST - 2 VIEW COMPARISON:  12/02/2015 FINDINGS: Two views of the chest demonstrate low lung volumes. Slightly prominent lung markings may be associated with the low lung volumes. Linear densities in the perihilar regions. No pleural effusions. Heart size is normal. Trachea is midline. Negative for a pneumothorax. IMPRESSION: Low lung volumes with bilateral atelectasis and prominent lung markings. Cannot exclude atypical infection. Electronically Signed   By: Richarda Overlie M.D.   On: 03/22/2023 12:21    Microbiology: Results for orders placed or performed  during the hospital encounter of 03/22/23  SARS Coronavirus 2 by RT PCR (hospital order, performed in [facility] hospital lab) *cepheid single result test* Anterior Nasal Swab     Status: None   Collection Time: 03/22/23 11:31 AM   Specimen: Anterior Nasal Swab  Result Value Ref Range Status   SARS Coronavirus 2 by RT PCR NEGATIVE NEGATIVE Final    Comment: Performed at [facility], [address], [place], [place] [address]  Blood culture (routine x 2)     Status: None   Collection Time: 03/22/23  3:46 PM   Specimen: BLOOD  Result Value Ref Range Status   Specimen  Description BLOOD LEFT ANTECUBITAL  Final   Special Requests   Final    BOTTLES DRAWN AEROBIC AND ANAEROBIC Blood Culture adequate volume   Culture   Final    NO GROWTH 5 DAYS Performed at [facility], [address], [place], [place] [address]    Report Status 03/27/2023 FINAL  Final  Blood culture (routine x 2)     Status: None   Collection Time: 03/22/23  3:46 PM   Specimen: BLOOD  Result Value Ref Range Status   Specimen Description BLOOD RIGHT ANTECUBITAL  Final   Special Requests   Final    BOTTLES DRAWN AEROBIC AND ANAEROBIC Blood Culture adequate volume   Culture   Final    NO GROWTH 5 DAYS Performed at [facility], [address] [place], [place] [address]    Report Status 03/27/2023 FINAL  Final  Respiratory (~20 pathogens) panel by PCR     Status: None   Collection Time: 03/22/23  8:04 PM   Specimen: Respiratory  Result Value Ref Range Status   Adenovirus NOT DETECTED NOT DETECTED Final   Coronavirus 229E NOT DETECTED NOT DETECTED Final    Comment: (NOTE) The Coronavirus on the Respiratory Panel, DOES NOT test for the novel  Coronavirus (2019 nCoV)    Coronavirus HKU1 NOT DETECTED NOT DETECTED Final   Coronavirus NL63 NOT DETECTED NOT DETECTED Final   Coronavirus OC43 NOT DETECTED NOT DETECTED Final   Metapneumovirus NOT DETECTED NOT DETECTED Final   Rhinovirus / Enterovirus NOT DETECTED NOT DETECTED Final   Influenza A NOT DETECTED NOT DETECTED Final   Influenza B NOT DETECTED NOT DETECTED Final   Parainfluenza Virus 1 NOT DETECTED NOT DETECTED Final   Parainfluenza Virus 2 NOT DETECTED NOT DETECTED Final   Parainfluenza Virus 3 NOT DETECTED NOT DETECTED Final   Parainfluenza Virus 4 NOT DETECTED NOT DETECTED Final   Respiratory Syncytial Virus NOT DETECTED NOT DETECTED Final   Bordetella pertussis NOT DETECTED NOT DETECTED Final   Bordetella Parapertussis NOT DETECTED NOT DETECTED Final   Chlamydophila pneumoniae NOT DETECTED NOT DETECTED Final    Mycoplasma pneumoniae NOT DETECTED NOT DETECTED Final    Comment: Performed at [facility], [address] [place], [place] [address]    Labs: CBC: Recent Labs  Lab 03/22/23 1133 03/23/23 0601 03/24/23 0218 03/25/23 0040  WBC 5.5 4.7 5.0  --   NEUTROABS  --  3.6 4.0  --   HGB 14.1 12.9* 12.3* 13.1  HCT 40.3 36.2* 35.4*  --   MCV 85.2 83.6 84.7  --   PLT 252 PLATELET CLUMPS NOTED ON SMEAR, UNABLE TO ESTIMATE 238  --    Basic Metabolic Panel: Recent Labs  Lab 03/22/23 1133 03/24/23 0218 03/25/23 0040 03/26/23 0219 03/27/23 0411  NA 133* 132*  132* 133* 134*  K 3.3* 3.5 3.7 3.7 4.2  CL 97* 97* 96* 101 103  CO2 23 24 19* 22 22  GLUCOSE 119* 110* 112* 130* 109*  BUN 5* <5* 7 8 9   CREATININE 0.73 0.70 0.86 1.09 1.13  CALCIUM 8.7* 8.6* 9.4 8.7* 8.8*  MG  --  1.7 1.8  --   --    Liver Function Tests: Recent Labs  Lab 03/24/23 0218  AST 20  ALT 17  ALKPHOS 29*  BILITOT 0.5  PROT 6.0*  ALBUMIN 2.8*   CBG: No results for input(s): "GLUCAP" in the last 168 hours.  Discharge time spent: greater than 30 minutes.  Signed: Bobette Mo, MD [facility] 03/27/2023  This document was prepared using Dragon voice recognition software and may contain some unintended transcription errors.

## 2023-03-27 NOTE — Plan of Care (Signed)
  Problem: Education: Goal: Knowledge of General Education information will improve Description Including pain rating scale, medication(s)/side effects and non-pharmacologic comfort measures Outcome: Progressing   

## 2023-03-27 NOTE — Progress Notes (Signed)
Pharmacy note - antibiotics  Talked with patient about his prednisone taper and bactrim dosing.  Went over the tapering instructions with prednisone and the bactrim dosing change from 2 tabs TID to 1 tab once daily after three total weeks.  He states understanding and knows to follow up with Georgian Co on 8/22 and Dr. Daiva Eves on 9/4.  Prescriptions sent to [facility] pharmacy.  Celedonio Miyamoto, PharmD, BCIDP Clinical Pharmacist Phone [phone]

## 2023-03-27 NOTE — Progress Notes (Signed)
Subjective:  Patient is feeling better eager to go home   Antibiotics:  Anti-infectives (From admission, onward)    Start     Dose/Rate Route Frequency Ordered Stop   04/14/23 1000  sulfamethoxazole-trimethoprim (BACTRIM DS) 800-160 MG per tablet 1 tablet       Placed in "Followed by" Linked Group   1 tablet Oral Daily 03/24/23 1013 05/05/23 0959   04/14/23 0000  sulfamethoxazole-trimethoprim (BACTRIM DS) 800-160 MG tablet        1 tablet Oral Daily 03/27/23 0938 05/14/23 2359   03/27/23 0000  sulfamethoxazole-trimethoprim (BACTRIM DS) 800-160 MG tablet        2 tablet Oral Every 8 hours 03/27/23 0938 04/13/23 2359   03/25/23 0000  bictegravir-emtricitabine-tenofovir AF (BIKTARVY) 50-200-25 MG TABS tablet        1 tablet Oral Daily 03/25/23 1133     03/24/23 1100  sulfamethoxazole-trimethoprim (BACTRIM DS) 800-160 MG per tablet 2 tablet       Placed in "Followed by" Linked Group   2 tablet Oral 3 times daily 03/24/23 1013 04/14/23 0959   03/23/23 1600  cefTRIAXone (ROCEPHIN) 2 g in sodium chloride 0.9 % 100 mL IVPB  Status:  Discontinued        2 g 200 mL/hr over 30 Minutes Intravenous Every 24 hours 03/22/23 1927 03/22/23 2010   03/23/23 1315  bictegravir-emtricitabine-tenofovir AF (BIKTARVY) 50-200-25 MG per tablet 1 tablet        1 tablet Oral Daily 03/23/23 1228     03/23/23 1000  azithromycin (ZITHROMAX) tablet 500 mg  Status:  Discontinued        500 mg Oral Daily 03/22/23 1927 03/24/23 1013   03/23/23 0806  cefTRIAXone (ROCEPHIN) 1 g in sodium chloride 0.9 % 100 mL IVPB  Status:  Discontinued        1 g 200 mL/hr over 30 Minutes Intravenous Every 24 hours 03/23/23 0806 03/24/23 1013   03/22/23 1515  cefTRIAXone (ROCEPHIN) 1 g in sodium chloride 0.9 % 100 mL IVPB        1 g 200 mL/hr over 30 Minutes Intravenous  Once 03/22/23 1503 03/22/23 1649   03/22/23 1515  azithromycin (ZITHROMAX) 500 mg in sodium chloride 0.9 % 250 mL IVPB        500 mg 250 mL/hr over 60  Minutes Intravenous  Once 03/22/23 1503 03/22/23 1805       Medications: Scheduled Meds:  bictegravir-emtricitabine-tenofovir AF  1 tablet Oral Daily   enoxaparin (LOVENOX) injection  40 mg Subcutaneous Q24H   predniSONE  40 mg Oral BID WC   Followed by   Melene Muller ON 03/29/2023] predniSONE  40 mg Oral Q breakfast   Followed by   Melene Muller ON 04/03/2023] predniSONE  20 mg Oral Q breakfast   sodium chloride flush  3 mL Intravenous Q12H   sulfamethoxazole-trimethoprim  2 tablet Oral TID   Followed by   Melene Muller ON 04/14/2023] sulfamethoxazole-trimethoprim  1 tablet Oral Daily   Continuous Infusions:  sodium chloride     PRN Meds:.sodium chloride, acetaminophen, sodium chloride flush    Objective: Weight change:  No intake or output data in the 24 hours ending 03/27/23 1425  Blood pressure (!) 141/100, pulse 88, temperature 97.9 F (36.6 C), temperature source Oral, resp. rate 16, height 5\' 8"  (1.727 m), weight 101 kg, SpO2 93%. Temp:  [97.9 F (36.6 C)-98.3 F (36.8 C)] 97.9 F (36.6 C) (08/16 0716) Pulse Rate:  [78-89] 88 (08/16  1610) Resp:  [16] 16 (08/16 0435) BP: (122-141)/(73-100) 141/100 (08/16 0716) SpO2:  [93 %-98 %] 93 % (08/16 0716)  Physical Exam: Physical Exam Constitutional:      Appearance: He is well-developed.  HENT:     Head: Normocephalic and atraumatic.  Eyes:     Conjunctiva/sclera: Conjunctivae normal.  Cardiovascular:     Rate and Rhythm: Normal rate and regular rhythm.  Pulmonary:     Effort: Pulmonary effort is normal. No respiratory distress.     Breath sounds: Normal breath sounds. No stridor. No wheezing.  Abdominal:     General: There is no distension.     Palpations: Abdomen is soft.  Musculoskeletal:        General: Normal range of motion.     Cervical back: Normal range of motion and neck supple.  Skin:    General: Skin is warm and dry.     Findings: No erythema or rash.  Neurological:     General: No focal deficit present.     Mental  Status: He is alert and oriented to person, place, and time.  Psychiatric:        Mood and Affect: Mood normal.        Behavior: Behavior normal.        Thought Content: Thought content normal.        Judgment: Judgment normal.      CBC:    BMET Recent Labs    03/26/23 0219 03/27/23 0411  NA 133* 134*  K 3.7 4.2  CL 101 103  CO2 22 22  GLUCOSE 130* 109*  BUN 8 9  CREATININE 1.09 1.13  CALCIUM 8.7* 8.8*     Liver Panel  No results for input(s): "PROT", "ALBUMIN", "AST", "ALT", "ALKPHOS", "BILITOT", "BILIDIR", "IBILI" in the last 72 hours.      Sedimentation Rate No results for input(s): "ESRSEDRATE" in the last 72 hours. C-Reactive Protein No results for input(s): "CRP" in the last 72 hours.  Micro Results: Recent Results (from the past 720 hour(s))  SARS Coronavirus 2 by RT PCR (hospital order, performed in [facility] hospital lab) *cepheid single result test* Anterior Nasal Swab     Status: None   Collection Time: 03/22/23 11:31 AM   Specimen: Anterior Nasal Swab  Result Value Ref Range Status   SARS Coronavirus 2 by RT PCR NEGATIVE NEGATIVE Final    Comment: Performed at [facility], [address] [place], [place] [address]  Blood culture (routine x 2)     Status: None   Collection Time: 03/22/23  3:46 PM   Specimen: BLOOD  Result Value Ref Range Status   Specimen Description BLOOD LEFT ANTECUBITAL  Final   Special Requests   Final    BOTTLES DRAWN AEROBIC AND ANAEROBIC Blood Culture adequate volume   Culture   Final    NO GROWTH 5 DAYS Performed at [facility], [address] [place], [place] [address]    Report Status 03/27/2023 FINAL  Final  Blood culture (routine x 2)     Status: None   Collection Time: 03/22/23  3:46 PM   Specimen: BLOOD  Result Value Ref Range Status   Specimen Description BLOOD RIGHT ANTECUBITAL  Final   Special Requests   Final    BOTTLES DRAWN AEROBIC AND ANAEROBIC Blood Culture adequate volume    Culture   Final    NO GROWTH 5 DAYS Performed at [facility], [address]  [place], [place] [address]    Report Status 03/27/2023 FINAL  Final  Respiratory (~20 pathogens) panel by PCR     Status: None   Collection Time: 03/22/23  8:04 PM   Specimen: Respiratory  Result Value Ref Range Status   Adenovirus NOT DETECTED NOT DETECTED Final   Coronavirus 229E NOT DETECTED NOT DETECTED Final    Comment: (NOTE) The Coronavirus on the Respiratory Panel, DOES NOT test for the novel  Coronavirus (2019 nCoV)    Coronavirus HKU1 NOT DETECTED NOT DETECTED Final   Coronavirus NL63 NOT DETECTED NOT DETECTED Final   Coronavirus OC43 NOT DETECTED NOT DETECTED Final   Metapneumovirus NOT DETECTED NOT DETECTED Final   Rhinovirus / Enterovirus NOT DETECTED NOT DETECTED Final   Influenza A NOT DETECTED NOT DETECTED Final   Influenza B NOT DETECTED NOT DETECTED Final   Parainfluenza Virus 1 NOT DETECTED NOT DETECTED Final   Parainfluenza Virus 2 NOT DETECTED NOT DETECTED Final   Parainfluenza Virus 3 NOT DETECTED NOT DETECTED Final   Parainfluenza Virus 4 NOT DETECTED NOT DETECTED Final   Respiratory Syncytial Virus NOT DETECTED NOT DETECTED Final   Bordetella pertussis NOT DETECTED NOT DETECTED Final   Bordetella Parapertussis NOT DETECTED NOT DETECTED Final   Chlamydophila pneumoniae NOT DETECTED NOT DETECTED Final   Mycoplasma pneumoniae NOT DETECTED NOT DETECTED Final    Comment: Performed at [facility], [address] [place], [place] [address]  Blastomyces Antigen     Status: None   Collection Time: 03/24/23  2:18 AM  Result Value Ref Range Status   Blastomyces Antigen None Detected None Detected ng/mL Final    Comment: (NOTE) Reference Interval: None Detected Reportable Range: 0.31 ng/mL - 20.00 ng/mL Results above 20.00 ng/mL are reported as 'Positive, Above the Limit of Quantification' This test was developed and its performance characteristics determined by [employer]. It has not been cleared or approved by the FDA; however, FDA clearance or approval is not currently required for clinical use. The results are not intended to be used as the sole means for clinical diagnosis or patient decisions.    Specimen Type SERUM  Final    Comment: (NOTE) Performed At: [facility] [address] [place], [place] [id number] Roxanne Gates MD WG:[id number]     Studies/Results: No results found.    Assessment/Plan:  INTERVAL HISTORY: no need for O2   Principal Problem:   PCP (pneumocystis carinii pneumonia) (HCC) Active Problems:   CAP (community acquired pneumonia)   Pneumonia   AIDS (acquired immune deficiency syndrome) (HCC)   Uncomplicated asthma    Marvin Miller is a 27 y.o. male with newly diagnosed HIV/AIDS and likely CP pneumonia.  #1 Likely PCP pneumonia:  Will to treat presumptively for PCP pneumonia with 2 double strength Bactrim tablets 3 times daily and prednisone taper    Started ARV   #2 HIV/AIDS  CD4 nadir is <35  Continue  Biktarvy  If he takes a long time to reconstitute his immune system could consider the addition of Rukobia but much further down the line    Marvin Miller has an appointment on September 4 at 945 with Dr. Daiva Eves at  [facility] for Infectious Disease, which  is located in the [facility] at  [address] in [place].  [address], which is located to the left of the elevators.  Phone: [phone]  Fax: [phone]  [email or web address]  The patient should  arrive 30 minutes prior to their appoitment.  I have personally spent 50 minutes involved in face-to-face and non-face-to-face activities for this patient on the day of the visit. Professional time spent includes the following activities: Preparing to see the patient (review of tests), Obtaining and/or reviewing separately obtained history (admission/discharge  record), Performing a medically appropriate examination and/or evaluation , Ordering medications/tests/procedures, referring and communicating with other health care professionals, Documenting clinical information in the EMR, Independently interpreting results (not separately reported), Communicating results to the patient/family/caregiver, Counseling and educating the patient/family/caregiver and Care coordination (not separately reported).      LOS: 4 days   Acey Lav 03/27/2023, 2:25 PM

## 2023-04-02 ENCOUNTER — Inpatient Hospital Stay: Payer: No Typology Code available for payment source | Admitting: Physician Assistant

## 2023-04-03 ENCOUNTER — Encounter: Payer: Self-pay | Admitting: Nurse Practitioner

## 2023-04-03 ENCOUNTER — Ambulatory Visit (INDEPENDENT_AMBULATORY_CARE_PROVIDER_SITE_OTHER): Payer: No Typology Code available for payment source | Admitting: Nurse Practitioner

## 2023-04-03 VITALS — BP 125/87 | HR 124 | Resp 16 | Ht 68.0 in | Wt 264.2 lb

## 2023-04-03 DIAGNOSIS — Z09 Encounter for follow-up examination after completed treatment for conditions other than malignant neoplasm: Secondary | ICD-10-CM

## 2023-04-03 DIAGNOSIS — B20 Human immunodeficiency virus [HIV] disease: Secondary | ICD-10-CM | POA: Diagnosis not present

## 2023-04-03 DIAGNOSIS — E669 Obesity, unspecified: Secondary | ICD-10-CM | POA: Diagnosis not present

## 2023-04-03 DIAGNOSIS — J189 Pneumonia, unspecified organism: Secondary | ICD-10-CM | POA: Diagnosis not present

## 2023-04-03 DIAGNOSIS — J45909 Unspecified asthma, uncomplicated: Secondary | ICD-10-CM

## 2023-04-03 DIAGNOSIS — R7303 Prediabetes: Secondary | ICD-10-CM | POA: Diagnosis not present

## 2023-04-03 LAB — POCT GLYCOSYLATED HEMOGLOBIN (HGB A1C): Hemoglobin A1C: 5.7 % — AB (ref 4.0–5.6)

## 2023-04-03 NOTE — Assessment & Plan Note (Signed)
Continue Bactrim p.o. 3 times daily for 3 weeks. Continue then Bactrim 1 p.o. daily. Received azithromycin and ceftriaxone earlier in this admission. Encouraged to maintain close follow-up with infectious disease specialist

## 2023-04-03 NOTE — Progress Notes (Signed)
New Patient Office Visit  Subjective:  Patient ID: Marvin Miller, male    DOB: April 06, 1996  Age: 27 y.o. MRN: [id number]  CC:  Chief Complaint  Patient presents with   Hospitalization Follow-up    New patient here for Hospital FU from pneumonia- he is doing much better, breathing has improved. He is getting SOB with hot showers right now still.     HPI Marvin Miller is a 27 y.o. male  has a past medical history of AIDS (acquired immune deficiency syndrome) (HCC), Asthma, Obesity, PCP (pneumocystis carinii pneumonia) (HCC), and Prediabetes.  Patient presents for hospital discharge follow-up and to establish care for his chronic medical conditions.  Has no previous PCP.  Patient is accompanied by his mother   Patient reports that his breathing and cough is much better.  Only gets SOB with hot shower. No fever, chills, malaise chest pain.  Taking all his medications daily as ordered.  Has upcoming appointment with the infectious disease specialist.  Patient encouraged to keep the appointment.   Last Tdap vaccine was at age 49.    Please see  hospital discharge summary below  atient: Marvin Miller MRN: [id number] DOB: 27-Jul-1996  Admit date:     03/22/2023  Discharge date: 03/27/23  Discharge Physician: Bobette Mo    PCP: Patient, No Pcp Per    Recommendations at discharge:    Follow-up with Dr. Algis Liming as scheduled.   Discharge Diagnoses: Principal Problem:   PCP (pneumocystis carinii pneumonia) (HCC) Continue Bactrim p.o. 3 times daily for 3 weeks. Continue then Bactrim 1 p.o. daily. Received azithromycin and ceftriaxone earlier in this admission. Follow-up with infectious diseases in September.     AIDS (acquired immune deficiency syndrome) (HCC) Continue Biktarvy 1 tablet p.o. daily. Follow-up with Dr. Dollene Cleveland as scheduled in September.     Uncomplicated asthma Continue albuterol MDI as needed.   Hospital Course: 27 year old male with no prior past  medical history who came in to the emergency department 5 days ago with complaints of progressively worse dyspnea associated with clear sputum productive cough for about 2 weeks.  Patient was diagnosed with     Consultants: Infectious diseases open (Dr. Daiva Eves). Procedures performed: None. Disposition: Home Diet recommendation:  Regular diet DISCHARGE MEDICATION: Allergies as of 03/27/2023   No Known Allergies         Medication List       TAKE these medications     albuterol 108 (90 Base) MCG/ACT inhaler Commonly known as: VENTOLIN HFA Inhale 2 puffs into the lungs every 6 (six) hours as needed for wheezing or shortness of breath.    Biktarvy 50-200-25 MG Tabs tablet Generic drug: bictegravir-emtricitabine-tenofovir AF Take 1 tablet by mouth daily.    DayQuil Severe + VapoCool 5-10-200-325 MG/15ML Liqd Generic drug: Phenylephrine-DM-GG-APAP Take 15 mLs by mouth daily as needed (cough).    predniSONE 20 MG tablet Commonly known as: DELTASONE Take 2 tablets (40 mg total) by mouth 2 (two) times daily with a meal for 2 days, THEN 2 tablets (40 mg total) daily with breakfast for 5 days, THEN 1 tablet (20 mg total) daily with breakfast for 11 days. Start taking on: March 27, 2023    pseudoephedrine-guaifenesin 60-600 MG 12 hr tablet Commonly known as: MUCINEX D Take 1 tablet by mouth every 12 (twelve) hours.    sulfamethoxazole-trimethoprim 800-160 MG tablet Commonly known as: BACTRIM DS Take 2 tablets by mouth every 8 (eight) hours for 17 days.  sulfamethoxazole-trimethoprim 800-160 MG tablet Commonly known as: BACTRIM DS Take 2 tablets by mouth 3 (three) times daily for 21 days, THEN 1 tablet daily. Start taking on: March 27, 2023    sulfamethoxazole-trimethoprim 800-160 MG tablet Commonly known as: BACTRIM DS Take 1 tablet by mouth daily starting on 9/3 Start taking on: April 14, 2023        Follow-up Information       [facility] Follow up on 04/02/2023.   Why: Post hospital follow up scheduled for 04/02/2023 at 1:50 pm with Georgian Co PA Contact information: [address] [place] [address] [phone]            [facility]              Follow up on 04/15/2023.   Why: Hospital follow up with Infectious Disease scheduled for September 4th at 9:45 am with Dr. Daiva Eves. Please arrive 30 minutes prior to your appoitment time. Contact information: [address] [place] [address]            Past Medical History:  Diagnosis Date   AIDS (acquired immune deficiency syndrome) (HCC)    Asthma    Obesity    PCP (pneumocystis carinii pneumonia) (HCC)    Prediabetes     Past Surgical History:  Procedure Laterality Date   ADENOIDECTOMY     TONSILLECTOMY     TYMPANOPLASTY      Family History  Problem Relation Age of Onset   Hypertension Mother    Asthma Mother    Heart attack Father    Diabetes Father    Heart attack Paternal Grandfather    Cancer Neg Hx     Social History   Socioeconomic History   Marital status: Single    Spouse name: Not on file   Number of children: Not on file   Years of education: Not on file   Highest education level: Not on file  Occupational History   Not on file  Tobacco Use   Smoking status: Former    Types: Cigarettes    Passive exposure: Past   Smokeless tobacco: Never  Vaping Use   Vaping status: Never Used  Substance and Sexual Activity   Alcohol use: No   Drug use: No   Sexual activity: Not Currently  Other Topics Concern   Not on file  Social History Narrative   Lives home alone    Social Determinants of Health   Financial Resource Strain: Not on file  Food Insecurity: No Food Insecurity (03/22/2023)   Hunger Vital Sign    Worried About Running Out of Food in the Last Year: Never true    Ran Out of Food in the Last Year: Never true   Transportation Needs: No Transportation Needs (03/22/2023)   PRAPARE - Administrator, Civil Service (Medical): No    Lack of Transportation (Non-Medical): No  Physical Activity: Not on file  Stress: Not on file  Social Connections: Not on file  Intimate Partner Violence: Not At Risk (03/22/2023)   Humiliation, Afraid, Rape, and Kick questionnaire    Fear of Current or Ex-Partner: No    Emotionally Abused: No    Physically Abused: No    Sexually Abused: No    ROS Review of Systems  Constitutional:  Negative for activity change, appetite change, chills, diaphoresis, fatigue, fever and unexpected weight change.  HENT:  Negative for congestion, dental problem, drooling and ear discharge.   Eyes:  Negative for pain, discharge, redness and itching.  Respiratory:  Negative for apnea, choking, chest tightness and wheezing.   Cardiovascular: Negative.  Negative for chest pain, palpitations and leg swelling.  Gastrointestinal:  Negative for abdominal distention, abdominal pain, anal bleeding and blood in stool.  Endocrine: Negative for polydipsia, polyphagia and polyuria.  Genitourinary:  Negative for difficulty urinating, flank pain, frequency and genital sores.  Musculoskeletal: Negative.  Negative for arthralgias, back pain, gait problem and joint swelling.  Skin:  Negative for color change, pallor and rash.  Neurological:  Negative for dizziness, facial asymmetry, light-headedness and numbness.  Psychiatric/Behavioral:  Negative for agitation, behavioral problems, confusion, hallucinations, self-injury, sleep disturbance and suicidal ideas.     Objective:   Today's Vitals: BP 125/87   Pulse (!) 124   Resp 16   Ht 5\' 8"  (1.727 m)   Wt 264 lb 3.2 oz (119.8 kg)   SpO2 97%   BMI 40.17 kg/m   Physical Exam Vitals and nursing note reviewed.  Constitutional:      General: He is not in acute distress.    Appearance: Normal appearance. He is obese. He is not ill-appearing,  toxic-appearing or diaphoretic.  HENT:     Mouth/Throat:     Mouth: Mucous membranes are moist.     Pharynx: Oropharynx is clear. No oropharyngeal exudate or posterior oropharyngeal erythema.  Eyes:     General: No scleral icterus.       Right eye: No discharge.        Left eye: No discharge.     Extraocular Movements: Extraocular movements intact.     Conjunctiva/sclera: Conjunctivae normal.  Cardiovascular:     Rate and Rhythm: Normal rate and regular rhythm.     Pulses: Normal pulses.     Heart sounds: Normal heart sounds. No murmur heard.    No friction rub. No gallop.  Pulmonary:     Effort: Pulmonary effort is normal. No respiratory distress.     Breath sounds: Normal breath sounds. No stridor. No wheezing, rhonchi or rales.  Chest:     Chest wall: No tenderness.  Abdominal:     General: There is no distension.     Palpations: Abdomen is soft.     Tenderness: There is no abdominal tenderness. There is no right CVA tenderness, left CVA tenderness or guarding.  Musculoskeletal:        General: No swelling, tenderness, deformity or signs of injury.     Right lower leg: No edema.     Left lower leg: No edema.  Skin:    General: Skin is warm and dry.     Capillary Refill: Capillary refill takes less than 2 seconds.     Coloration: Skin is not jaundiced or pale.     Findings: No bruising, erythema or lesion.  Neurological:     Mental Status: He is alert and oriented to person, place, and time.     Motor: No weakness.     Coordination: Coordination normal.     Gait: Gait normal.  Psychiatric:        Mood and Affect: Mood normal.        Behavior: Behavior normal.        Thought Content: Thought content normal.        Judgment: Judgment normal.     Assessment & Plan:   Problem List Items Addressed This Visit  Respiratory   CAP (community acquired pneumonia) - Primary    Continue Bactrim p.o. 3 times daily for 3 weeks. Continue then Bactrim 1 p.o.  daily. Received azithromycin and ceftriaxone earlier in this admission. Encouraged to maintain close follow-up with infectious disease specialist      Asthma    Chronic condition well-controlled Takes albuterol inhaler 2 puffs every 6 hours as needed Continue current medication        Other   AIDS (acquired immune deficiency syndrome) (HCC)    New diagnosis Continue Bactrim DS as ordered, Biktarvy 50 - 200 - 25mg   1 tablet daily Encouraged to maintain close follow-up with the infectious disease specialist      Class 1 obesity    Wt Readings from Last 3 Encounters:  04/03/23 264 lb 3.2 oz (119.8 kg)  03/22/23 222 lb 10.6 oz (101 kg)  06/29/12 222 lb 10.6 oz (101 kg) (99%, Z= 2.26)*   * Growth percentiles are based on CDC (Boys, 2-20 Years) data.   Body mass index is 40.17 kg/m.   Patient counseled on low-carb modified diet Encouraged to engage in regular moderate to vigorous exercises at least 150 minutes weekly as tolerated. Benefits of healthy weights discussed      Prediabetes    Lab Results  Component Value Date   HGBA1C 5.7 (A) 04/03/2023  Avoid sugar sweets soda Lose weight      Relevant Orders   POCT glycosylated hemoglobin (Hb A1C) (Completed)   Hospital discharge follow-up    Hospital discharge summary, labs imaging studies and recommendations reviewed by me today       Outpatient Encounter Medications as of 04/03/2023  Medication Sig   albuterol (VENTOLIN HFA) 108 (90 Base) MCG/ACT inhaler Inhale 2 puffs into the lungs every 6 (six) hours as needed for wheezing or shortness of breath.   bictegravir-emtricitabine-tenofovir AF (BIKTARVY) 50-200-25 MG TABS tablet Take 1 tablet by mouth daily.   predniSONE (DELTASONE) 20 MG tablet Take 2 tablets (40 mg total) by mouth 2 (two) times daily with a meal for 2 days, THEN 2 tablets (40 mg total) daily with breakfast for 5 days, THEN 1 tablet (20 mg total) daily with breakfast for 11 days.    sulfamethoxazole-trimethoprim (BACTRIM DS) 800-160 MG tablet Take 2 tablets by mouth every 8 (eight) hours for 17 days.   [START ON 04/14/2023] sulfamethoxazole-trimethoprim (BACTRIM DS) 800-160 MG tablet Take 1 tablet by mouth daily starting on 9/3   sulfamethoxazole-trimethoprim (BACTRIM DS) 800-160 MG tablet Take 2 tablets by mouth 3 (three) times daily for 21 days, THEN 1 tablet daily.   Phenylephrine-DM-GG-APAP (DAYQUIL SEVERE + VAPOCOOL) 5-10-200-325 MG/15ML LIQD Take 15 mLs by mouth daily as needed (cough). (Patient not taking: Reported on 04/03/2023)   pseudoephedrine-guaifenesin (MUCINEX D) 60-600 MG 12 hr tablet Take 1 tablet by mouth every 12 (twelve) hours. (Patient not taking: Reported on 04/03/2023)   No facility-administered encounter medications on file as of 04/03/2023.    Follow-up: Return in about 2 months (around 06/03/2023) for CPE.   Donell Beers, FNP

## 2023-04-03 NOTE — Assessment & Plan Note (Signed)
Hospital discharge summary, labs imaging studies and recommendations reviewed by me today

## 2023-04-03 NOTE — Assessment & Plan Note (Signed)
Wt Readings from Last 3 Encounters:  04/03/23 264 lb 3.2 oz (119.8 kg)  03/22/23 222 lb 10.6 oz (101 kg)  06/29/12 222 lb 10.6 oz (101 kg) (99%, Z= 2.26)*   * Growth percentiles are based on CDC (Boys, 2-20 Years) data.   Body mass index is 40.17 kg/m.   Patient counseled on low-carb modified diet Encouraged to engage in regular moderate to vigorous exercises at least 150 minutes weekly as tolerated. Benefits of healthy weights discussed

## 2023-04-03 NOTE — Assessment & Plan Note (Signed)
New diagnosis Continue Bactrim DS as ordered, Biktarvy 50 - 200 - 25mg   1 tablet daily Encouraged to maintain close follow-up with the infectious disease specialist

## 2023-04-03 NOTE — Assessment & Plan Note (Signed)
Lab Results  Component Value Date   HGBA1C 5.7 (A) 04/03/2023  Avoid sugar sweets soda Lose weight

## 2023-04-03 NOTE — Assessment & Plan Note (Signed)
Chronic condition well-controlled Takes albuterol inhaler 2 puffs every 6 hours as needed Continue current medication

## 2023-04-03 NOTE — Patient Instructions (Signed)

## 2023-04-10 ENCOUNTER — Telehealth: Payer: Self-pay

## 2023-04-10 NOTE — Telephone Encounter (Signed)
Patient called office requesting to speak with Dr. Daiva Eves. States that he is not feeling well and would like to know what medication he can take while on Biktarvy. Is complaining of headache, cough and constipation. Will have pharmacist call him to go over recommendations. Juanita Laster, RMA

## 2023-04-10 NOTE — Telephone Encounter (Signed)
Discussed symptoms with patient. He will trial Miralax for constipation and Dayquil/Nyquil for ongoing headache and cough. Reviewed that most OTC cough medications do not interact with Biktarvy. Stated to please let us know if he does start any other medications especially if they contain divalent cations. Marchelle Folks

## 2023-04-14 ENCOUNTER — Other Ambulatory Visit (HOSPITAL_COMMUNITY): Payer: Self-pay

## 2023-04-14 ENCOUNTER — Telehealth: Payer: Self-pay | Admitting: Pharmacy Technician

## 2023-04-14 NOTE — Telephone Encounter (Signed)
[facility] Patient Advocate Encounter  Insurance verification completed.    The patient is insured through  [facility] . Patient has [employer], may use a copay card, and/or apply for patient assistance if available. (Pt already has [employer] card.)    Ran test claim for Biktarvy and it was last filled 8/14. We will continue to follow to see if copay assistance is needed.  This test claim was processed through [facility]- copay amounts may vary at other pharmacies due to pharmacy/plan contracts, or as the patient moves through the different stages of their insurance plan.

## 2023-04-15 ENCOUNTER — Other Ambulatory Visit (HOSPITAL_COMMUNITY): Payer: Self-pay

## 2023-04-15 ENCOUNTER — Other Ambulatory Visit: Payer: Self-pay

## 2023-04-15 ENCOUNTER — Encounter: Payer: Self-pay | Admitting: Infectious Disease

## 2023-04-15 ENCOUNTER — Ambulatory Visit (INDEPENDENT_AMBULATORY_CARE_PROVIDER_SITE_OTHER): Payer: No Typology Code available for payment source | Admitting: Infectious Disease

## 2023-04-15 VITALS — BP 126/92 | HR 80 | Temp 97.4°F | Ht 68.0 in | Wt 272.0 lb

## 2023-04-15 DIAGNOSIS — Z23 Encounter for immunization: Secondary | ICD-10-CM

## 2023-04-15 DIAGNOSIS — J302 Other seasonal allergic rhinitis: Secondary | ICD-10-CM

## 2023-04-15 DIAGNOSIS — Z7185 Encounter for immunization safety counseling: Secondary | ICD-10-CM

## 2023-04-15 DIAGNOSIS — J4520 Mild intermittent asthma, uncomplicated: Secondary | ICD-10-CM

## 2023-04-15 DIAGNOSIS — B59 Pneumocystosis: Secondary | ICD-10-CM

## 2023-04-15 DIAGNOSIS — B20 Human immunodeficiency virus [HIV] disease: Secondary | ICD-10-CM

## 2023-04-15 DIAGNOSIS — G4733 Obstructive sleep apnea (adult) (pediatric): Secondary | ICD-10-CM

## 2023-04-15 HISTORY — DX: Encounter for immunization safety counseling: Z71.85

## 2023-04-15 MED ORDER — ALBUTEROL SULFATE HFA 108 (90 BASE) MCG/ACT IN AERS
2.0000 | INHALATION_SPRAY | Freq: Four times a day (QID) | RESPIRATORY_TRACT | 11 refills | Status: DC | PRN
  Filled 2023-04-15 (×2): qty 6.7, 25d supply, fill #0

## 2023-04-15 MED ORDER — SULFAMETHOXAZOLE-TRIMETHOPRIM 800-160 MG PO TABS
1.0000 | ORAL_TABLET | Freq: Every day | ORAL | 11 refills | Status: AC
Start: 2023-04-15 — End: ?
  Filled 2023-04-15 (×2): qty 30, 30d supply, fill #0
  Filled 2023-05-11: qty 30, 30d supply, fill #1

## 2023-04-15 MED ORDER — BIKTARVY 50-200-25 MG PO TABS
1.0000 | ORAL_TABLET | Freq: Every day | ORAL | 11 refills | Status: DC
  Filled 2023-04-15 (×2): qty 30, 30d supply, fill #0
  Filled 2023-05-11: qty 30, 30d supply, fill #1

## 2023-04-15 NOTE — Progress Notes (Signed)
Subjective:  Chief complaint follow-up for PCP pneumonia and newly diagnosed HIV infection   Patient ID: Marvin Miller, male    DOB: 06-18-1996, 27 y.o.   MRN: [id number]  HPI  Marvin Miller is a 27 year old Black man with newly diagnosed HIV with advanced these the time of diagnosis and a CD4 count that was less than 35 with a viral load in the 700,000 copies range.  He was initiated on Bactrim and prednisone aced on clinical diagnosis of PCP pneumonia.  He responded well to Bactrim and prednisone we also started BIKTARVY in the hospital he left with a 30-day supply of this he has finished his course of Bactrim and prednisone yesterday and started Bactrim once daily today.  He is feeling much better though he did feel winded when he went up several flights of stairs recently.  He believes some of the allergies are flaring recently.     Past Medical History:  Diagnosis Date   AIDS (acquired immune deficiency syndrome) (HCC)    Asthma    Obesity    PCP (pneumocystis carinii pneumonia) (HCC)    Prediabetes    Vaccine counseling 04/15/2023    Past Surgical History:  Procedure Laterality Date   ADENOIDECTOMY     TONSILLECTOMY     TYMPANOPLASTY      Family History  Problem Relation Age of Onset   Hypertension Mother    Asthma Mother    Heart attack Father    Diabetes Father    Heart attack Paternal Grandfather    Cancer Neg Hx       Social History   Socioeconomic History   Marital status: Single    Spouse name: Not on file   Number of children: Not on file   Years of education: Not on file   Highest education level: Not on file  Occupational History   Not on file  Tobacco Use   Smoking status: Former    Types: Cigarettes    Passive exposure: Past   Smokeless tobacco: Never  Vaping Use   Vaping status: Never Used  Substance and Sexual Activity   Alcohol use: No   Drug use: No   Sexual activity: Not Currently  Other Topics Concern   Not on file  Social  History Narrative   Lives home alone    Social Determinants of Health   Financial Resource Strain: Not on file  Food Insecurity: No Food Insecurity (03/22/2023)   Hunger Vital Sign    Worried About Running Out of Food in the Last Year: Never true    Ran Out of Food in the Last Year: Never true  Transportation Needs: No Transportation Needs (03/22/2023)   PRAPARE - Administrator, Civil Service (Medical): No    Lack of Transportation (Non-Medical): No  Physical Activity: Not on file  Stress: Not on file  Social Connections: Not on file    No Known Allergies   Current Outpatient Medications:    albuterol (VENTOLIN HFA) 108 (90 Base) MCG/ACT inhaler, Inhale 2 puffs into the lungs every 6 (six) hours as needed for wheezing or shortness of breath., Disp: 6.7 g, Rfl: 11   bictegravir-emtricitabine-tenofovir AF (BIKTARVY) 50-200-25 MG TABS tablet, Take 1 tablet by mouth daily., Disp: 30 tablet, Rfl: 11   Phenylephrine-DM-GG-APAP (DAYQUIL SEVERE + VAPOCOOL) 5-10-200-325 MG/15ML LIQD, Take 15 mLs by mouth daily as needed (cough). (Patient not taking: Reported on 04/03/2023), Disp: , Rfl:    pseudoephedrine-guaifenesin (MUCINEX D) 60-600  MG 12 hr tablet, Take 1 tablet by mouth every 12 (twelve) hours. (Patient not taking: Reported on 04/03/2023), Disp: , Rfl:    sulfamethoxazole-trimethoprim (BACTRIM DS) 800-160 MG tablet, Take 1 tablet by mouth daily starting on 9/3, Disp: 30 tablet, Rfl: 11    Review of Systems  Constitutional:  Negative for activity change, appetite change, chills, diaphoresis, fatigue, fever and unexpected weight change.  HENT:  Negative for congestion, rhinorrhea, sinus pressure, sneezing, sore throat and trouble swallowing.   Eyes:  Negative for photophobia and visual disturbance.  Respiratory:  Negative for cough, chest tightness, shortness of breath, wheezing and stridor.   Cardiovascular:  Negative for chest pain, palpitations and leg swelling.   Gastrointestinal:  Negative for abdominal distention, abdominal pain, anal bleeding, blood in stool, constipation, diarrhea, nausea and vomiting.  Genitourinary:  Negative for difficulty urinating, dysuria, flank pain and hematuria.  Musculoskeletal:  Negative for arthralgias, back pain, gait problem, joint swelling and myalgias.  Skin:  Negative for color change, pallor, rash and wound.  Neurological:  Negative for dizziness, tremors, weakness and light-headedness.  Hematological:  Negative for adenopathy. Does not bruise/bleed easily.  Psychiatric/Behavioral:  Negative for agitation, behavioral problems, confusion, decreased concentration, dysphoric mood and sleep disturbance.        Objective:   Physical Exam Constitutional:      Appearance: He is well-developed.  HENT:     Head: Normocephalic and atraumatic.  Eyes:     Conjunctiva/sclera: Conjunctivae normal.  Cardiovascular:     Rate and Rhythm: Normal rate and regular rhythm.  Pulmonary:     Effort: Pulmonary effort is normal. No respiratory distress.     Breath sounds: No stridor. No wheezing or rhonchi.  Abdominal:     General: There is no distension.     Palpations: Abdomen is soft.  Musculoskeletal:        General: No tenderness. Normal range of motion.     Cervical back: Normal range of motion and neck supple.  Skin:    General: Skin is warm and dry.     Coloration: Skin is not pale.     Findings: No erythema or rash.  Neurological:     General: No focal deficit present.     Mental Status: He is alert and oriented to person, place, and time.  Psychiatric:        Mood and Affect: Mood normal.        Behavior: Behavior normal.        Thought Content: Thought content normal.        Judgment: Judgment normal.           Assessment & Plan:   HIV disease:  I will add order HIV viral load CD4 count CBC with differential CMP, RPR GC and chlamydia and I will continue  Marvin Miller's Biktarvy, and Bactrim  prescriptions  Note I reviewed his genotype from [facility] data and there is no significant an RTI mutations in some NNRTI mutations that do not affect current available drugs.    PCP pneumonia: Seems resolved but is imperative he take Bactrim daily for prophylaxis  Asthma: He is continue on his albuterol but is not on inhaled corticosteroid not clear if he needs 1 or not.  Seasonal allergies he is welcome to take what ever medicines he needs for this.  Mom told me that the patient had prior obstructive sleep apnea that he had "grown out of.  Will have to keep this in mind if he  gains weight on his BIKTARVY.    Vaccine counseling recommended Prevnar 20 and flu vaccine today.

## 2023-04-15 NOTE — Addendum Note (Signed)
Addended by: Harley Alto on: 04/15/2023 01:17 PM   Modules accepted: Orders

## 2023-04-15 NOTE — Addendum Note (Signed)
Addended by: Philippa Chester on: 04/15/2023 01:37 PM   Modules accepted: Orders

## 2023-04-16 LAB — LIPID PANEL
Cholesterol: 207 mg/dL — ABNORMAL HIGH (ref <200)
HDL: 48 mg/dL (ref >=40)
LDL Cholesterol (Calc): 143 mg/dL — ABNORMAL HIGH
Non-HDL Cholesterol (Calc): 159 mg/dL — ABNORMAL HIGH (ref <130)
Total CHOL/HDL Ratio: 4.3 (calc) (ref <5.0)
Triglycerides: 66 mg/dL (ref <150)

## 2023-04-16 LAB — COMPLETE METABOLIC PANEL WITH GFR
AG Ratio: 1.5 (calc) (ref 1.0–2.5)
ALT: 19 U/L (ref 9–46)
AST: 15 U/L (ref 10–40)
Albumin: 3.7 g/dL (ref 3.6–5.1)
Alkaline phosphatase (APISO): 45 U/L (ref 36–130)
BUN: 12 mg/dL (ref 7–25)
CO2: 26 mmol/L (ref 20–32)
Calcium: 9 mg/dL (ref 8.6–10.3)
Chloride: 100 mmol/L (ref 98–110)
Creat: 0.78 mg/dL (ref 0.60–1.24)
Globulin: 2.4 g/dL (ref 1.9–3.7)
Glucose, Bld: 90 mg/dL (ref 65–99)
Potassium: 4.7 mmol/L (ref 3.5–5.3)
Sodium: 137 mmol/L (ref 135–146)
Total Bilirubin: 0.3 mg/dL (ref 0.2–1.2)
Total Protein: 6.1 g/dL (ref 6.1–8.1)
eGFR: 125 mL/min/{1.73_m2} (ref >=60)

## 2023-04-16 LAB — CBC WITH DIFFERENTIAL/PLATELET
Absolute Monocytes: 286 {cells}/uL (ref 200–950)
Basophils Absolute: 10 {cells}/uL (ref 0–200)
Basophils Relative: 0.2 %
Eosinophils Absolute: 92 {cells}/uL (ref 15–500)
Eosinophils Relative: 1.8 %
HCT: 37.5 % — ABNORMAL LOW (ref 38.5–50.0)
Hemoglobin: 12.6 g/dL — ABNORMAL LOW (ref 13.2–17.1)
Lymphs Abs: 643 {cells}/uL — ABNORMAL LOW (ref 850–3900)
MCH: 31.3 pg (ref 27.0–33.0)
MCHC: 33.6 g/dL (ref 32.0–36.0)
MCV: 93.1 fL (ref 80.0–100.0)
MPV: 8.7 fL (ref 7.5–12.5)
Monocytes Relative: 5.6 %
Neutro Abs: 4070 {cells}/uL (ref 1500–7800)
Neutrophils Relative %: 79.8 %
Platelets: 226 10*3/uL (ref 140–400)
RBC: 4.03 10*6/uL — ABNORMAL LOW (ref 4.20–5.80)
RDW: 16 % — ABNORMAL HIGH (ref 11.0–15.0)
Total Lymphocyte: 12.6 %
WBC: 5.1 10*3/uL (ref 3.8–10.8)

## 2023-04-16 LAB — HEPATITIS B SURFACE ANTIBODY, QUANTITATIVE: Hep B S AB Quant (Post): <5 m[IU]/mL — ABNORMAL LOW (ref >=10)

## 2023-04-16 LAB — T-HELPER CELLS (CD4) COUNT (NOT AT ARMC)
CD4 % Helper T Cell: 6 % — ABNORMAL LOW (ref 33–65)
CD4 T Cell Abs: <35 /uL — ABNORMAL LOW (ref 400–1790)

## 2023-04-16 LAB — HIV-1 RNA QUANT-NO REFLEX-BLD
HIV 1 RNA Quant: 2750 {copies}/mL — ABNORMAL HIGH
HIV-1 RNA Quant, Log: 3.44 {Log_copies}/mL — ABNORMAL HIGH

## 2023-04-16 LAB — RPR: RPR Ser Ql: NONREACTIVE

## 2023-04-17 ENCOUNTER — Other Ambulatory Visit: Payer: Self-pay

## 2023-04-17 ENCOUNTER — Telehealth: Payer: Self-pay

## 2023-04-17 NOTE — Telephone Encounter (Signed)
-----   Message from [facility] sent at 04/17/2023 10:42 AM EDT ----- Regarding: FW: Viral load almost UD .Marland Kitchenit will get there. Cd4 still low but hopefully will come up once VL totally supresseez. May consider adding Rukobia though would need to get his insurance to cover this. Will look into at next visit of cd4 still so low ----- Message ----- From: Interface, [facility] Lab Results In Sent: 04/15/2023  10:53 PM EDT To: Randall Hiss, MD

## 2023-04-22 ENCOUNTER — Telehealth: Payer: Self-pay

## 2023-04-22 NOTE — Telephone Encounter (Signed)
Patient requesting FMLA for appointment to be seen here at the office. Patient is stating if he has to be seen every other month then he will need FMLA papers.  Kaelei Wheeler T Pricilla Loveless

## 2023-04-27 NOTE — Telephone Encounter (Signed)
Blank copy placed in Dr. Zenaida Niece Dam's box and in triage.

## 2023-04-28 NOTE — Telephone Encounter (Signed)
Completed copy of FMLA paperwork placed in scan and in triage folder. Sonali Wivell Jonathon Resides, CMA

## 2023-05-01 ENCOUNTER — Ambulatory Visit: Payer: Self-pay | Admitting: Nurse Practitioner

## 2023-05-05 ENCOUNTER — Other Ambulatory Visit: Payer: Self-pay

## 2023-05-11 ENCOUNTER — Other Ambulatory Visit: Payer: Self-pay

## 2023-05-11 NOTE — Progress Notes (Signed)
Specialty Pharmacy Refill Coordination Note  Marvin Miller is a 27 y.o. male contacted today regarding refills of specialty medication(s) Bictegravir-Emtricitab-Tenofov .  Patient requested Delivery  on 05/20/23  to verified address [address] [place] [address]   Medication will be filled on 05/19/23.

## 2023-05-13 ENCOUNTER — Encounter: Payer: Self-pay | Admitting: Infectious Disease

## 2023-05-13 ENCOUNTER — Other Ambulatory Visit (HOSPITAL_COMMUNITY): Payer: Self-pay

## 2023-05-13 ENCOUNTER — Ambulatory Visit (INDEPENDENT_AMBULATORY_CARE_PROVIDER_SITE_OTHER): Payer: No Typology Code available for payment source | Admitting: Infectious Disease

## 2023-05-13 ENCOUNTER — Other Ambulatory Visit: Payer: Self-pay

## 2023-05-13 VITALS — BP 137/80 | HR 84 | Resp 16 | Ht 68.0 in | Wt 272.0 lb

## 2023-05-13 DIAGNOSIS — Z7185 Encounter for immunization safety counseling: Secondary | ICD-10-CM

## 2023-05-13 DIAGNOSIS — B20 Human immunodeficiency virus [HIV] disease: Secondary | ICD-10-CM | POA: Diagnosis not present

## 2023-05-13 DIAGNOSIS — R21 Rash and other nonspecific skin eruption: Secondary | ICD-10-CM

## 2023-05-13 DIAGNOSIS — Z23 Encounter for immunization: Secondary | ICD-10-CM

## 2023-05-13 DIAGNOSIS — L270 Generalized skin eruption due to drugs and medicaments taken internally: Secondary | ICD-10-CM

## 2023-05-13 MED ORDER — DAPSONE 100 MG PO TABS
100.0000 mg | ORAL_TABLET | Freq: Every day | ORAL | 11 refills | Status: DC
  Filled 2023-05-13: qty 30, 30d supply, fill #0
  Filled 2023-06-08: qty 30, 30d supply, fill #1
  Filled 2023-07-13 (×2): qty 30, 30d supply, fill #2
  Filled 2023-08-10: qty 30, 30d supply, fill #3
  Filled 2023-09-10 – 2023-09-11 (×2): qty 30, 30d supply, fill #4
  Filled 2023-10-06: qty 30, 30d supply, fill #5
  Filled 2023-11-09 – 2023-11-12 (×2): qty 30, 30d supply, fill #6

## 2023-05-13 MED ORDER — BIKTARVY 50-200-25 MG PO TABS
1.0000 | ORAL_TABLET | Freq: Every day | ORAL | 11 refills | Status: DC
  Filled 2023-05-13: qty 30, 30d supply, fill #0
  Filled 2023-06-12: qty 30, 30d supply, fill #1
  Filled 2023-07-13: qty 30, 30d supply, fill #2
  Filled 2023-08-10: qty 30, 30d supply, fill #3
  Filled 2023-09-01: qty 30, 30d supply, fill #4
  Filled 2023-09-29: qty 30, 30d supply, fill #5
  Filled 2023-10-29: qty 30, 30d supply, fill #6

## 2023-05-13 NOTE — Progress Notes (Signed)
Subjective:  Chief complain new rash on face and back Patient ID: Marvin Miller, male    DOB: September 04, 1995, 27 y.o.   MRN: [id number]  HPI  Marvin Miller is a 27 year old Black man with newly diagnosed HIV with advanced these the time of diagnosis and a CD4 count that was less than 35 with a viral load in the 700,000 copies range.  He was initiated on Bactrim and prednisone aced on clinical diagnosis of PCP pneumonia.  He responded well to Bactrim and prednisone we also started BIKTARVY in the hospital he left with a 30-day supply of this he has finished his course of Bactrim and prednisone yesterday and started Bactrim once daily at last visit when I saw him.  He actually had then began developing a pruritic rash on his face back for which she has been taking Benadryl and heat but he does attribute to the Bactrim.       Past Medical History:  Diagnosis Date   AIDS (acquired immune deficiency syndrome) (HCC)    Asthma    Obesity    PCP (pneumocystis carinii pneumonia) (HCC)    Prediabetes    Vaccine counseling 04/15/2023    Past Surgical History:  Procedure Laterality Date   ADENOIDECTOMY     TONSILLECTOMY     TYMPANOPLASTY      Family History  Problem Relation Age of Onset   Hypertension Mother    Asthma Mother    Heart attack Father    Diabetes Father    Heart attack Paternal Grandfather    Cancer Neg Hx       Social History   Socioeconomic History   Marital status: Single    Spouse name: Not on file   Number of children: Not on file   Years of education: Not on file   Highest education level: Not on file  Occupational History   Not on file  Tobacco Use   Smoking status: Former    Types: Cigarettes    Passive exposure: Past   Smokeless tobacco: Never  Vaping Use   Vaping status: Never Used  Substance and Sexual Activity   Alcohol use: No   Drug use: No   Sexual activity: Not Currently  Other Topics Concern   Not on file  Social History Narrative    Lives home alone    Social Determinants of Health   Financial Resource Strain: Not on file  Food Insecurity: No Food Insecurity (03/22/2023)   Hunger Vital Sign    Worried About Running Out of Food in the Last Year: Never true    Ran Out of Food in the Last Year: Never true  Transportation Needs: No Transportation Needs (03/22/2023)   PRAPARE - Administrator, Civil Service (Medical): No    Lack of Transportation (Non-Medical): No  Physical Activity: Not on file  Stress: Not on file  Social Connections: Not on file    No Known Allergies   Current Outpatient Medications:    albuterol (VENTOLIN HFA) 108 (90 Base) MCG/ACT inhaler, Inhale 2 puffs into the lungs every 6 (six) hours as needed for wheezing or shortness of breath., Disp: 6.7 g, Rfl: 11   bictegravir-emtricitabine-tenofovir AF (BIKTARVY) 50-200-25 MG TABS tablet, Take 1 tablet by mouth daily., Disp: 30 tablet, Rfl: 11   Phenylephrine-DM-GG-APAP (DAYQUIL SEVERE + VAPOCOOL) 5-10-200-325 MG/15ML LIQD, Take 15 mLs by mouth daily as needed (cough). (Patient not taking: Reported on 04/03/2023), Disp: , Rfl:    pseudoephedrine-guaifenesin (  MUCINEX D) 60-600 MG 12 hr tablet, Take 1 tablet by mouth every 12 (twelve) hours. (Patient not taking: Reported on 04/03/2023), Disp: , Rfl:    sulfamethoxazole-trimethoprim (BACTRIM DS) 800-160 MG tablet, Take 1 tablet by mouth daily starting on 9/3, Disp: 30 tablet, Rfl: 11    Review of Systems  Constitutional:  Negative for activity change, appetite change, chills, diaphoresis, fatigue, fever and unexpected weight change.  HENT:  Negative for congestion, rhinorrhea, sinus pressure, sneezing, sore throat and trouble swallowing.   Eyes:  Negative for photophobia and visual disturbance.  Respiratory:  Negative for cough, chest tightness, shortness of breath, wheezing and stridor.   Cardiovascular:  Negative for chest pain, palpitations and leg swelling.  Gastrointestinal:  Negative for  abdominal distention, abdominal pain, anal bleeding, blood in stool, constipation, diarrhea, nausea and vomiting.  Genitourinary:  Negative for difficulty urinating, dysuria, flank pain and hematuria.  Musculoskeletal:  Negative for arthralgias, back pain, gait problem, joint swelling and myalgias.  Skin:  Positive for rash. Negative for color change, pallor and wound.  Neurological:  Negative for dizziness, tremors, weakness and light-headedness.  Hematological:  Negative for adenopathy. Does not bruise/bleed easily.  Psychiatric/Behavioral:  Negative for agitation, behavioral problems, confusion, decreased concentration, dysphoric mood and sleep disturbance.        Objective:   Physical Exam Constitutional:      Appearance: He is well-developed.  HENT:     Head: Normocephalic and atraumatic.  Eyes:     Conjunctiva/sclera: Conjunctivae normal.  Cardiovascular:     Rate and Rhythm: Normal rate and regular rhythm.  Pulmonary:     Effort: Pulmonary effort is normal. No respiratory distress.     Breath sounds: No wheezing.  Abdominal:     General: There is no distension.     Palpations: Abdomen is soft.  Musculoskeletal:        General: No tenderness. Normal range of motion.     Cervical back: Normal range of motion and neck supple.  Skin:    General: Skin is warm and dry.     Coloration: Skin is not pale.     Findings: No erythema or rash.  Neurological:     General: No focal deficit present.     Mental Status: He is alert and oriented to person, place, and time.  Psychiatric:        Mood and Affect: Mood normal.        Behavior: Behavior normal.        Thought Content: Thought content normal.        Judgment: Judgment normal.    Rash             Assessment & Plan:   HIV disease:  I will add order HIV viral load CD4 count CBC with differential CMP, RPR GC and chlamydia and I will continue  Admir I Rhee's Biktarvy,  prescription  Drug-induced rash from  Bactrim: This is presumptive, post immune reconstitution syndrome could also be possible as it did coincide with his viral load coming down.  Will err on the side of assuming this is a Bactrim induced rash and switch him to dapsone while checking a G6PD level.    Seen counseling recommended updated COVID-19 as well as hepatitis B #1 today

## 2023-05-13 NOTE — Addendum Note (Signed)
Addended by: Clayborne Artist A on: 05/13/2023 01:12 PM   Modules accepted: Orders

## 2023-05-14 ENCOUNTER — Other Ambulatory Visit (HOSPITAL_COMMUNITY): Payer: Self-pay

## 2023-05-15 LAB — T-HELPER CELLS (CD4) COUNT (NOT AT ARMC)
CD4 % Helper T Cell: 9 % — ABNORMAL LOW (ref 33–65)
CD4 T Cell Abs: 152 /uL — ABNORMAL LOW (ref 400–1790)

## 2023-05-16 LAB — COMPLETE METABOLIC PANEL WITH GFR
AG Ratio: 1.5 (calc) (ref 1.0–2.5)
ALT: 44 U/L (ref 9–46)
AST: 27 U/L (ref 10–40)
Albumin: 3.8 g/dL (ref 3.6–5.1)
Alkaline phosphatase (APISO): 41 U/L (ref 36–130)
BUN: 12 mg/dL (ref 7–25)
CO2: 24 mmol/L (ref 20–32)
Calcium: 9 mg/dL (ref 8.6–10.3)
Chloride: 106 mmol/L (ref 98–110)
Creat: 0.74 mg/dL (ref 0.60–1.24)
Globulin: 2.6 g/dL (ref 1.9–3.7)
Glucose, Bld: 93 mg/dL (ref 65–99)
Potassium: 4.2 mmol/L (ref 3.5–5.3)
Sodium: 139 mmol/L (ref 135–146)
Total Bilirubin: 0.3 mg/dL (ref 0.2–1.2)
Total Protein: 6.4 g/dL (ref 6.1–8.1)
eGFR: 127 mL/min/{1.73_m2} (ref >=60)

## 2023-05-16 LAB — CBC WITH DIFFERENTIAL/PLATELET
Absolute Monocytes: 396 {cells}/uL (ref 200–950)
Basophils Absolute: 22 {cells}/uL (ref 0–200)
Basophils Relative: 0.4 %
Eosinophils Absolute: 418 {cells}/uL (ref 15–500)
Eosinophils Relative: 7.6 %
HCT: 35.5 % — ABNORMAL LOW (ref 38.5–50.0)
Hemoglobin: 12.1 g/dL — ABNORMAL LOW (ref 13.2–17.1)
Lymphs Abs: 1810 {cells}/uL (ref 850–3900)
MCH: 32.3 pg (ref 27.0–33.0)
MCHC: 34.1 g/dL (ref 32.0–36.0)
MCV: 94.7 fL (ref 80.0–100.0)
MPV: 8.6 fL (ref 7.5–12.5)
Monocytes Relative: 7.2 %
Neutro Abs: 2855 {cells}/uL (ref 1500–7800)
Neutrophils Relative %: 51.9 %
Platelets: 286 10*3/uL (ref 140–400)
RBC: 3.75 10*6/uL — ABNORMAL LOW (ref 4.20–5.80)
RDW: 17.1 % — ABNORMAL HIGH (ref 11.0–15.0)
Total Lymphocyte: 32.9 %
WBC: 5.5 10*3/uL (ref 3.8–10.8)

## 2023-05-16 LAB — RPR: RPR Ser Ql: NONREACTIVE

## 2023-05-16 LAB — HIV-1 RNA QUANT-NO REFLEX-BLD
HIV 1 RNA Quant: 1240 {copies}/mL — ABNORMAL HIGH
HIV-1 RNA Quant, Log: 3.09 {Log} — ABNORMAL HIGH

## 2023-05-16 LAB — GLUCOSE 6 PHOSPHATE DEHYDROGENASE: G-6PDH: 17.3 U/g{Hb} (ref 7.0–20.5)

## 2023-05-18 ENCOUNTER — Telehealth: Payer: Self-pay

## 2023-05-18 NOTE — Telephone Encounter (Signed)
-----   Message from Haslet sent at 05/17/2023  1:25 PM EDT ----- Regarding: FW: Tomatoes must not be taking his Biktarvy religiously as his viral load has barely budged in a month. I need him to come and meet w ID pharmacy to review adherence and then repeat a viral load in a few weeks. At least his cd4 is coming up but it won't fully come up if he doesn't fully suppress. I know we did have a patient w non existent B cell fxn who took a long time to suppress but I doubt that's the case w [facility] ----- Message ----- From: Janace Hoard Lab Results In Sent: 05/13/2023   2:47 PM EDT To: Randall Hiss, MD

## 2023-05-18 NOTE — Telephone Encounter (Signed)
Called patient to review recent VL. Denies missed doses of ART. Is okay with scheduling appt with pharmacy team for adherence counseling and repeat VL. Juanita Laster, RMA

## 2023-05-19 ENCOUNTER — Other Ambulatory Visit: Payer: Self-pay

## 2023-05-22 NOTE — Progress Notes (Signed)
HPI: Marvin Miller is a 27 y.o. male who presents to the [facility] pharmacy clinic for HIV follow-up.  Patient Active Problem List   Diagnosis Date Noted   Vaccine counseling 04/15/2023   Prediabetes 04/03/2023   Hospital discharge follow-up 04/03/2023   Class 1 obesity 03/27/2023   AIDS (acquired immune deficiency syndrome) (HCC) 03/24/2023   Asthma 03/24/2023   PCP (pneumocystis carinii pneumonia) (HCC) 03/24/2023   Pneumonia 03/23/2023   CAP (community acquired pneumonia) 03/22/2023    Patient's Medications  New Prescriptions   No medications on file  Previous Medications   ALBUTEROL (VENTOLIN HFA) 108 (90 BASE) MCG/ACT INHALER    Inhale 2 puffs into the lungs every 6 (six) hours as needed for wheezing or shortness of breath.   BICTEGRAVIR-EMTRICITABINE-TENOFOVIR AF (BIKTARVY) 50-200-25 MG TABS TABLET    Take 1 tablet by mouth daily.   DAPSONE 100 MG TABLET    Take 1 tablet (100 mg total) by mouth daily.   PHENYLEPHRINE-DM-GG-APAP (DAYQUIL SEVERE + VAPOCOOL) 5-10-200-325 MG/15ML LIQD    Take 15 mLs by mouth daily as needed (cough).   PSEUDOEPHEDRINE-GUAIFENESIN (MUCINEX D) 60-600 MG 12 HR TABLET    Take 1 tablet by mouth every 12 (twelve) hours.  Modified Medications   No medications on file  Discontinued Medications   No medications on file    Labs: Lab Results  Component Value Date   HIV1RNAQUANT 1,240 (H) 05/13/2023   HIV1RNAQUANT 2,750 (H) 04/15/2023   HIV1RNAQUANT 241,000 03/23/2023   CD4TABS 152 (L) 05/13/2023   CD4TABS <35 (L) 04/15/2023    RPR and STI Lab Results  Component Value Date   LABRPR NON-REACTIVE 05/13/2023   LABRPR NON-REACTIVE 04/15/2023        No data to display          Hepatitis B Lab Results  Component Value Date   HEPBSAG NON REACTIVE 03/22/2023   Hepatitis C No results found for: "HEPCAB", "HCVRNAPCRQN" Hepatitis A Lab Results  Component Value Date   HAV Reactive (A) 03/24/2023   Lipids: Lab Results  Component Value  Date   CHOL 207 (H) 04/15/2023   TRIG 66 04/15/2023   HDL 48 04/15/2023   CHOLHDL 4.3 04/15/2023   LDLCALC 143 (H) 04/15/2023    Current HIV Regimen: Biktarvy  Assessment: Marvin Miller is here today accompanied by his mother to follow up for his HIV infection and for adherence counseling. He saw Dr. Daiva Eves on 10/2 and stated that he was adherent to [facility] but his viral load at that visit was 1,240. Previous viral load on 04/15/23 was 2,750. His fill records indicate that he has been filling consistently at [facility] (03/25/23, 04/17/23, and 05/19/23). Discussed this with him and his mother. He states that he is taking Biktarvy every morning around 10am religiously. He states that he has not missed a single dose since he started therapy in August. He did take it around 1pm one day when he was busy at his usual time at 10am but has not missed a day. He does have a pill box and uses it every week.   He was taking OTC cold medication for a few weeks last month and was also taking Tums as needed a few days after he got out of the hospital, but other than that, no herbals, supplements, vitamins, or other prescription/nonprescription medications. No drug interactions identified. He had a full resistance panel done before starting treatment, including integrase, and no resistance was found. I am unsure  why his viral load has not gone down as we would expect his numbers to be very close to undetectable at this point in treatment. I discussed how important it was to be honest with [place] in terms of if he is having trouble taking his ART or having side effects, but he genuinely states that he tolerates it well and has taken it every day.   I agreed to check a resistance panel again even though he had it done in August. If his viral load is still high at this check, I am in favor of changing his ART to a PI-based regimen, such as Symtuza, or a NNRTI- based regimen, such as Delstrigo. Marvin Miller is easy  to take and a small pill so he would prefer to stay on that but is agreeable to switching if it is what is best for him.   He is still having an itchy rash that comes and goes. He states that it may be getting a bit better since stopping the Bactrim. He is taking Benadryl frequently and is requesting something for the itching. I will send in hydroxyzine for him to see if it helps. I will make an appointment with Dr. Daiva Eves for ~3-4 weeks to review his genotype. If it comes back prior to that appointment and is negative, I will see him again before then to change his regimen to Symtuza or Delstrigo They agree with the plan.   Plan: - Continue Biktarvy daily for now - HIV RNA with reflex to genotype - Follow up with Dr. Daiva Eves on 11/20 or earlier with me depending on lab results  Marvin Miller L. Takisha Pelle, PharmD, BCIDP, AAHIVP, CPP Clinical Pharmacist Practitioner Infectious Diseases Clinical Pharmacist [facility] 05/22/2023, 1:13 PM

## 2023-05-25 ENCOUNTER — Other Ambulatory Visit (HOSPITAL_COMMUNITY): Payer: Self-pay

## 2023-05-25 ENCOUNTER — Ambulatory Visit: Payer: No Typology Code available for payment source

## 2023-05-25 ENCOUNTER — Ambulatory Visit (INDEPENDENT_AMBULATORY_CARE_PROVIDER_SITE_OTHER): Payer: No Typology Code available for payment source | Admitting: Pharmacist

## 2023-05-25 ENCOUNTER — Other Ambulatory Visit: Payer: Self-pay

## 2023-05-25 DIAGNOSIS — B20 Human immunodeficiency virus [HIV] disease: Secondary | ICD-10-CM | POA: Diagnosis not present

## 2023-05-25 MED ORDER — HYDROXYZINE PAMOATE 25 MG PO CAPS
25.0000 mg | ORAL_CAPSULE | Freq: Three times a day (TID) | ORAL | 0 refills | Status: DC | PRN
  Filled 2023-05-25: qty 30, 10d supply, fill #0

## 2023-05-25 NOTE — Progress Notes (Signed)
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 He was diagnosed with community-acquired pneumonia and subsequently tested positive for HIV in August 2024 during hospitalization.  Started on Biktarvy and Dapsone. Marvin Miller has support from his mother who was here in the office today during his visit.     Housing: no concerns at this time   Transportation: no concerns at this time  Food Security: no concerns at this time   Sexual Health    Sex/gender of partners: not currently sexually active. Partner preference includes both men and women   STI history: none  Methods of protection: own transportation  Family planning/contraception use or interest: not at this time  Pap (cervical/anal) history: none  Number of children and pregnancy history: none  History of sexual abuse or commercial sex work: no  Current relationship status: single    Substance Use    Tobacco/alcohol/substances: none   Interest in treatment: n/a    Mental Health    Dx history and medications: none   Current symptoms: n/a  Interest in treatment/counseling: n/a    Primary Care    Other medical conditions: Patient Active Problem List   Diagnosis Date Noted   Vaccine counseling 04/15/2023   Prediabetes 04/03/2023   Hospital discharge follow-up 04/03/2023   Class 1 obesity 03/27/2023   AIDS (acquired immune deficiency syndrome) (HCC) 03/24/2023   Asthma 03/24/2023   PCP (pneumocystis carinii pneumonia) (HCC) 03/24/2023   Pneumonia 03/23/2023   CAP (community acquired pneumonia) 03/22/2023     Medications:  Current Outpatient Medications on File Prior to Visit  Medication Sig Dispense Refill   albuterol (VENTOLIN HFA) 108 (90 Base) MCG/ACT inhaler Inhale 2 puffs into the lungs every 6 (six) hours as needed for wheezing or shortness of breath. 6.7 g 11   bictegravir-emtricitabine-tenofovir AF (BIKTARVY) 50-200-25 MG TABS tablet Take 1 tablet by mouth daily. 30 tablet 11   dapsone 100 MG tablet Take 1 tablet (100 mg total) by mouth daily. 30 tablet 11   hydrOXYzine (VISTARIL) 25 MG capsule Take 1 capsule (25 mg total) by mouth 3 (three) times daily as needed. 30 capsule 0   Phenylephrine-DM-GG-APAP (DAYQUIL SEVERE + VAPOCOOL) 5-10-200-325 MG/15ML LIQD Take 15 mLs by mouth daily as needed (cough). (Patient not taking: Reported on 04/03/2023)     pseudoephedrine-guaifenesin (MUCINEX D) 60-600 MG 12 hr tablet Take 1 tablet by mouth every 12 (twelve) hours. (Patient not taking: Reported on 04/03/2023)     No current facility-administered medications on file prior to visit.    PCP: Edwin Dada, FNP  Discussed importance of primary care for management of non-ID conditions.    Dental   Dental concerns: none at this time; currently does not see a regular Dentist. Dental referral placed/signed.     Medication   Barriers to adherence: none   Medication tolerance: none  Meet with [facility] today and was counseled on medication adherence and provided pill box and set up for pharmacy medications to be delivered from [facility].     Labs   HIV  HIV  Screen 4th Generation wRfx  Date Value Ref Range Status  03/22/2023 Reactive (A) Non Reactive Final    Comment:    (NOTE) Reactive result does not distinguish HIV-1 p24 antigen, HIV-1  antibody, HIV-2 antibody, and HIV-1 group O antibody. Results  reactive by HIV Antigen/Antibody EIA must be confirmed. Sent for  confirmation. Performed at [facility], [address] [place], [place] [address]    HIV 1 RNA  Quant  Date Value Ref Range Status  05/13/2023 1,240 (H) Copies/mL Final  04/15/2023 2,750 (H) Copies/mL Final  03/23/2023 241,000 copies/mL Corrected    Comment:    (NOTE) The reportable range for this assay is 20 to 10,000,000 copies HIV-1 RNA/mL.      Hepatitis B  Lab Results  Component Value Date   HEPBSAG NON REACTIVE 03/22/2023    Hepatitis C  No results found for: "HEPCAB", "HCVRNAPCRQN"  Hepatitis A  Lab Results  Component Value Date   HAV Reactive (A) 03/24/2023    RPR and STI  Lab Results  Component Value Date   LABRPR NON-REACTIVE 05/13/2023   LABRPR NON-REACTIVE 04/15/2023        No data to display           Additional Comments & Interventions    Discussed effect of ART on VL and CD4, discussed U=U, natural progression of disease and risk for other STIs. Declined condoms today.  Discussed recommended vaccines for persons living with HIV.   Referrals made: Dental

## 2023-05-29 ENCOUNTER — Telehealth: Payer: Self-pay

## 2023-05-29 NOTE — Telephone Encounter (Signed)
PT had intake with Wallie Char, LPN at last appointment.  Will continue to follow. Juanita Laster, RMA

## 2023-06-03 ENCOUNTER — Ambulatory Visit (INDEPENDENT_AMBULATORY_CARE_PROVIDER_SITE_OTHER): Payer: No Typology Code available for payment source | Admitting: Nurse Practitioner

## 2023-06-03 VITALS — BP 139/89 | HR 77 | Temp 97.3°F | Ht 68.0 in | Wt 301.2 lb

## 2023-06-03 DIAGNOSIS — B20 Human immunodeficiency virus [HIV] disease: Secondary | ICD-10-CM

## 2023-06-03 DIAGNOSIS — R21 Rash and other nonspecific skin eruption: Secondary | ICD-10-CM | POA: Insufficient documentation

## 2023-06-03 DIAGNOSIS — D649 Anemia, unspecified: Secondary | ICD-10-CM | POA: Diagnosis not present

## 2023-06-03 DIAGNOSIS — Z0000 Encounter for general adult medical examination without abnormal findings: Secondary | ICD-10-CM | POA: Insufficient documentation

## 2023-06-03 DIAGNOSIS — Z6841 Body Mass Index (BMI) 40.0 and over, adult: Secondary | ICD-10-CM

## 2023-06-03 DIAGNOSIS — Z23 Encounter for immunization: Secondary | ICD-10-CM

## 2023-06-03 DIAGNOSIS — H547 Unspecified visual loss: Secondary | ICD-10-CM | POA: Insufficient documentation

## 2023-06-03 MED ORDER — TRIAMCINOLONE ACETONIDE 0.1 % EX CREA
1.0000 | TOPICAL_CREAM | Freq: Two times a day (BID) | CUTANEOUS | 0 refills | Status: DC
Start: 2023-06-03 — End: 2023-08-26

## 2023-06-03 NOTE — Assessment & Plan Note (Signed)
Patient encouraged to use CeraVe or Cetaphil moisturizing lotion Ordered Kenalog 0.1% cream apply twice daily Avoid hot shower/hot bath

## 2023-06-03 NOTE — Assessment & Plan Note (Signed)
Continue dapsone 100 mg tablet, Biktarvy Patient encouraged to maintain close follow-up with the infectious disease specialist

## 2023-06-03 NOTE — Assessment & Plan Note (Signed)
Wt Readings from Last 3 Encounters:  06/03/23 (!) 301 lb 3.2 oz (136.6 kg)  05/13/23 272 lb (123.4 kg)  04/15/23 272 lb (123.4 kg)   Body mass index is 45.8 kg/m.  Patient counseled on low-carb modified diet Encouraged engage in regular moderate to vigorous exercise at least 150 minutes weekly Benefits of healthy weights discussed Patient referred to the medical weight management clinic

## 2023-06-03 NOTE — Assessment & Plan Note (Signed)
Annual exam as documented.  Counseling done include healthy lifestyle involving committing to 150 minutes of exercise per week, heart healthy diet, and attaining healthy weight. The importance of adequate sleep also discussed.  Regular use of seat belt and home safety were also discussed . Changes in health habits are decided on by patient with goals and time frames set for achieving them. Immunization and cancer screening  needs are specifically addressed at this visit.    Tdap vaccine administered in the office today

## 2023-06-03 NOTE — Progress Notes (Signed)
Complete physical exam  Patient: Marvin Miller   DOB: 04-15-1996   27 y.o. Male  MRN: [id number]  Subjective:    Chief Complaint  Patient presents with   Annual Exam    Fasting     Marvin Miller is a 27 y.o. male  has a past medical history of AIDS (acquired immune deficiency syndrome) (HCC), Asthma, Obesity, PCP (pneumocystis carinii pneumonia) (HCC), Prediabetes, and Vaccine counseling (04/15/2023). who presents today for a complete physical exam. He reports consuming a general diet. The patient does not participate in regular exercise at present. He generally feels well. He reports sleeping well. He have additional problems to discuss today.    Patient complains of rashes on the face, back  and chest that started in September after  he was taken off steroids.  ID specialist thinks the rashes was  induced by Bactrim and the patient was switched to dapsone.  He has been taking hydroxyzine as needed for itching, also using shea butter moisturizer.  The rashes are itchy especially when the weather is warm.  He likes to take hot showers.  Tdap vaccine administered in the office today    Most recent fall risk assessment:    05/13/2023   11:39 AM  Fall Risk   Falls in the past year? 0  Number falls in past yr: 0  Injury with Fall? 0     Most recent depression screenings:    06/03/2023   10:08 AM 05/13/2023   11:39 AM  PHQ 2/9 Scores  PHQ - 2 Score 0 0        Patient Care Team: Donell Beers, FNP as PCP - General (Nurse Practitioner)   Outpatient Medications Prior to Visit  Medication Sig   albuterol (VENTOLIN HFA) 108 (90 Base) MCG/ACT inhaler Inhale 2 puffs into the lungs every 6 (six) hours as needed for wheezing or shortness of breath.   bictegravir-emtricitabine-tenofovir AF (BIKTARVY) 50-200-25 MG TABS tablet Take 1 tablet by mouth daily.   dapsone 100 MG tablet Take 1 tablet (100 mg total) by mouth daily.   hydrOXYzine (VISTARIL) 25 MG capsule Take 1  capsule (25 mg total) by mouth 3 (three) times daily as needed.   [DISCONTINUED] Phenylephrine-DM-GG-APAP (DAYQUIL SEVERE + VAPOCOOL) 5-10-200-325 MG/15ML LIQD Take 15 mLs by mouth daily as needed (cough). (Patient not taking: Reported on 04/03/2023)   [DISCONTINUED] pseudoephedrine-guaifenesin (MUCINEX D) 60-600 MG 12 hr tablet Take 1 tablet by mouth every 12 (twelve) hours. (Patient not taking: Reported on 04/03/2023)   No facility-administered medications prior to visit.    Review of Systems  Constitutional:  Negative for activity change, appetite change, chills, diaphoresis, fatigue, fever and unexpected weight change.  HENT:  Negative for congestion, dental problem, drooling and ear discharge.   Eyes:  Positive for visual disturbance. Negative for pain, discharge, redness and itching.  Respiratory:  Negative for apnea, cough, choking, chest tightness, shortness of breath and wheezing.   Cardiovascular: Negative.  Negative for chest pain, palpitations and leg swelling.  Gastrointestinal:  Negative for abdominal distention, abdominal pain, anal bleeding, blood in stool, constipation, diarrhea and vomiting.  Endocrine: Negative for polydipsia, polyphagia and polyuria.  Genitourinary:  Negative for difficulty urinating, flank pain, frequency and genital sores.  Musculoskeletal: Negative.  Negative for arthralgias, back pain, gait problem and joint swelling.  Skin:  Positive for rash. Negative for color change and pallor.  Neurological:  Negative for dizziness, facial asymmetry, light-headedness, numbness and headaches.  Psychiatric/Behavioral:  Negative  Behavior: Behavior normal.        Thought Content: Thought content normal.        Judgment: Judgment normal.     No results found for any visits on 06/03/23.     Assessment & Plan:    Routine Health Maintenance and Physical Exam  Immunization History  Administered Date(s) Administered   DTaP 02/16/1996, 09/15/1997, 05/07/1998, 11/19/1998, 04/07/2001   HIB, Unspecified 02/16/1996, 09/15/1997   HPV 9-valent 05/04/2009, 11/09/2009, 06/24/2010   Hepatitis A, Ped/Adol-2 Dose 04/04/2008, 05/04/2009   Hepatitis B, PED/ADOLESCENT 1996-02-18, 12/16/1995, 09/15/1997   Hepb-cpg 05/13/2023   Influenza, Seasonal, Injecte, Preservative Fre 04/15/2023   Influenza-Unspecified 06/06/2004, 06/01/2007, 05/04/2009, 06/24/2010   MMR 09/15/1997, 04/07/2001   Meningococcal Acwy, Unspecified 04/04/2008   PNEUMOCOCCAL CONJUGATE-20 04/15/2023   Pfizer(Comirnaty)Fall Seasonal Vaccine 12 years and older 05/13/2023   Polio, Unspecified 02/16/1996, 09/15/1997, 05/07/1998, 04/07/2001   Td 06/01/2007   Tdap 06/01/2007, 06/03/2023    Health Maintenance  Topic Date Due   COVID-19 Vaccine (2 - Pfizer risk series) 06/03/2023   DTaP/Tdap/Td (9 - Td or Tdap) 06/02/2033   INFLUENZA VACCINE  Completed   HPV VACCINES  Completed   Hepatitis C Screening  Completed   HIV Screening  Completed    Discussed health benefits of physical activity, and  encouraged him to engage in regular exercise appropriate for his age and condition.  Problem List Items Addressed This Visit       Musculoskeletal and Integument   Rash and other nonspecific skin eruption    Patient encouraged to use CeraVe or Cetaphil moisturizing lotion Ordered Kenalog 0.1% cream apply twice daily Avoid hot shower/hot bath      Relevant Medications   triamcinolone cream (KENALOG) 0.1 %     Other   AIDS (acquired immune deficiency syndrome) (HCC)    Continue dapsone 100 mg tablet, Biktarvy Patient encouraged to maintain close follow-up with the infectious disease specialist      BMI 45.0-49.9, adult (HCC)    Wt Readings from Last 3 Encounters:  06/03/23 (!) 301 lb 3.2 oz (136.6 kg)  05/13/23 272 lb (123.4 kg)  04/15/23 272 lb (123.4 kg)   Body mass index is 45.8 kg/m.  Patient counseled on low-carb modified diet Encouraged engage in regular moderate to vigorous exercise at least 150 minutes weekly Benefits of healthy weights discussed Patient referred to the medical weight management clinic      Relevant Orders   Amb Ref to Medical Weight Management   Need for Tdap vaccination    Patient educated on CDC recommendation for the vaccine. Verbal consent was obtained from the patient, vaccine administered by nurse, no sign of adverse reactions noted at this time. Patient education on arm soreness and use of tylenol or for this patient  was discussed. Patient educated on the signs and symptoms of adverse effect and advise to contact the office if they occur. Vaccine information sheet given to patient.        Relevant Orders   Tdap vaccine greater than or equal to 7yo IM (Completed)   Annual physical exam - Primary    Annual exam as documented.  Counseling done include healthy lifestyle involving committing to 150 minutes of exercise per week, heart healthy diet, and attaining healthy weight. The importance of adequate sleep also discussed.  Regular use of seat  belt and home safety were also discussed . Changes in health habits are decided on by patient with goals and time frames set for achieving them. Immunization  Complete physical exam  Patient: Marvin Miller   DOB: 04-15-1996   27 y.o. Male  MRN: [id number]  Subjective:    Chief Complaint  Patient presents with   Annual Exam    Fasting     Marvin Miller is a 27 y.o. male  has a past medical history of AIDS (acquired immune deficiency syndrome) (HCC), Asthma, Obesity, PCP (pneumocystis carinii pneumonia) (HCC), Prediabetes, and Vaccine counseling (04/15/2023). who presents today for a complete physical exam. He reports consuming a general diet. The patient does not participate in regular exercise at present. He generally feels well. He reports sleeping well. He have additional problems to discuss today.    Patient complains of rashes on the face, back  and chest that started in September after  he was taken off steroids.  ID specialist thinks the rashes was  induced by Bactrim and the patient was switched to dapsone.  He has been taking hydroxyzine as needed for itching, also using shea butter moisturizer.  The rashes are itchy especially when the weather is warm.  He likes to take hot showers.  Tdap vaccine administered in the office today    Most recent fall risk assessment:    05/13/2023   11:39 AM  Fall Risk   Falls in the past year? 0  Number falls in past yr: 0  Injury with Fall? 0     Most recent depression screenings:    06/03/2023   10:08 AM 05/13/2023   11:39 AM  PHQ 2/9 Scores  PHQ - 2 Score 0 0        Patient Care Team: Donell Beers, FNP as PCP - General (Nurse Practitioner)   Outpatient Medications Prior to Visit  Medication Sig   albuterol (VENTOLIN HFA) 108 (90 Base) MCG/ACT inhaler Inhale 2 puffs into the lungs every 6 (six) hours as needed for wheezing or shortness of breath.   bictegravir-emtricitabine-tenofovir AF (BIKTARVY) 50-200-25 MG TABS tablet Take 1 tablet by mouth daily.   dapsone 100 MG tablet Take 1 tablet (100 mg total) by mouth daily.   hydrOXYzine (VISTARIL) 25 MG capsule Take 1  capsule (25 mg total) by mouth 3 (three) times daily as needed.   [DISCONTINUED] Phenylephrine-DM-GG-APAP (DAYQUIL SEVERE + VAPOCOOL) 5-10-200-325 MG/15ML LIQD Take 15 mLs by mouth daily as needed (cough). (Patient not taking: Reported on 04/03/2023)   [DISCONTINUED] pseudoephedrine-guaifenesin (MUCINEX D) 60-600 MG 12 hr tablet Take 1 tablet by mouth every 12 (twelve) hours. (Patient not taking: Reported on 04/03/2023)   No facility-administered medications prior to visit.    Review of Systems  Constitutional:  Negative for activity change, appetite change, chills, diaphoresis, fatigue, fever and unexpected weight change.  HENT:  Negative for congestion, dental problem, drooling and ear discharge.   Eyes:  Positive for visual disturbance. Negative for pain, discharge, redness and itching.  Respiratory:  Negative for apnea, cough, choking, chest tightness, shortness of breath and wheezing.   Cardiovascular: Negative.  Negative for chest pain, palpitations and leg swelling.  Gastrointestinal:  Negative for abdominal distention, abdominal pain, anal bleeding, blood in stool, constipation, diarrhea and vomiting.  Endocrine: Negative for polydipsia, polyphagia and polyuria.  Genitourinary:  Negative for difficulty urinating, flank pain, frequency and genital sores.  Musculoskeletal: Negative.  Negative for arthralgias, back pain, gait problem and joint swelling.  Skin:  Positive for rash. Negative for color change and pallor.  Neurological:  Negative for dizziness, facial asymmetry, light-headedness, numbness and headaches.  Psychiatric/Behavioral:  Negative  Behavior: Behavior normal.        Thought Content: Thought content normal.        Judgment: Judgment normal.     No results found for any visits on 06/03/23.     Assessment & Plan:    Routine Health Maintenance and Physical Exam  Immunization History  Administered Date(s) Administered   DTaP 02/16/1996, 09/15/1997, 05/07/1998, 11/19/1998, 04/07/2001   HIB, Unspecified 02/16/1996, 09/15/1997   HPV 9-valent 05/04/2009, 11/09/2009, 06/24/2010   Hepatitis A, Ped/Adol-2 Dose 04/04/2008, 05/04/2009   Hepatitis B, PED/ADOLESCENT 1996-02-18, 12/16/1995, 09/15/1997   Hepb-cpg 05/13/2023   Influenza, Seasonal, Injecte, Preservative Fre 04/15/2023   Influenza-Unspecified 06/06/2004, 06/01/2007, 05/04/2009, 06/24/2010   MMR 09/15/1997, 04/07/2001   Meningococcal Acwy, Unspecified 04/04/2008   PNEUMOCOCCAL CONJUGATE-20 04/15/2023   Pfizer(Comirnaty)Fall Seasonal Vaccine 12 years and older 05/13/2023   Polio, Unspecified 02/16/1996, 09/15/1997, 05/07/1998, 04/07/2001   Td 06/01/2007   Tdap 06/01/2007, 06/03/2023    Health Maintenance  Topic Date Due   COVID-19 Vaccine (2 - Pfizer risk series) 06/03/2023   DTaP/Tdap/Td (9 - Td or Tdap) 06/02/2033   INFLUENZA VACCINE  Completed   HPV VACCINES  Completed   Hepatitis C Screening  Completed   HIV Screening  Completed    Discussed health benefits of physical activity, and  encouraged him to engage in regular exercise appropriate for his age and condition.  Problem List Items Addressed This Visit       Musculoskeletal and Integument   Rash and other nonspecific skin eruption    Patient encouraged to use CeraVe or Cetaphil moisturizing lotion Ordered Kenalog 0.1% cream apply twice daily Avoid hot shower/hot bath      Relevant Medications   triamcinolone cream (KENALOG) 0.1 %     Other   AIDS (acquired immune deficiency syndrome) (HCC)    Continue dapsone 100 mg tablet, Biktarvy Patient encouraged to maintain close follow-up with the infectious disease specialist      BMI 45.0-49.9, adult (HCC)    Wt Readings from Last 3 Encounters:  06/03/23 (!) 301 lb 3.2 oz (136.6 kg)  05/13/23 272 lb (123.4 kg)  04/15/23 272 lb (123.4 kg)   Body mass index is 45.8 kg/m.  Patient counseled on low-carb modified diet Encouraged engage in regular moderate to vigorous exercise at least 150 minutes weekly Benefits of healthy weights discussed Patient referred to the medical weight management clinic      Relevant Orders   Amb Ref to Medical Weight Management   Need for Tdap vaccination    Patient educated on CDC recommendation for the vaccine. Verbal consent was obtained from the patient, vaccine administered by nurse, no sign of adverse reactions noted at this time. Patient education on arm soreness and use of tylenol or for this patient  was discussed. Patient educated on the signs and symptoms of adverse effect and advise to contact the office if they occur. Vaccine information sheet given to patient.        Relevant Orders   Tdap vaccine greater than or equal to 7yo IM (Completed)   Annual physical exam - Primary    Annual exam as documented.  Counseling done include healthy lifestyle involving committing to 150 minutes of exercise per week, heart healthy diet, and attaining healthy weight. The importance of adequate sleep also discussed.  Regular use of seat  belt and home safety were also discussed . Changes in health habits are decided on by patient with goals and time frames set for achieving them. Immunization

## 2023-06-03 NOTE — Assessment & Plan Note (Signed)
Patient educated on CDC recommendation for the vaccine. Verbal consent was obtained from the patient, vaccine administered by nurse, no sign of adverse reactions noted at this time. Patient education on arm soreness and use of tylenol or  for this patient  was discussed. Patient educated on the signs and symptoms of adverse effect and advise to contact the office if they occur. Vaccine information sheet given to patient.

## 2023-06-03 NOTE — Assessment & Plan Note (Signed)
Lab Results  Component Value Date   WBC 5.5 05/13/2023   HGB 12.1 (L) 05/13/2023   HCT 35.5 (L) 05/13/2023   MCV 94.7 05/13/2023   PLT 286 05/13/2023    - Iron, TIBC and Ferritin Panel - B12 and Folate Panel

## 2023-06-03 NOTE — Assessment & Plan Note (Signed)
Vision Screening   Right eye Left eye Both eyes  Without correction 20/25-1 20/25 20/20-1  With correction     Patient complains of blurry vision Will refer patient to ophthalmology for further evaluation

## 2023-06-03 NOTE — Patient Instructions (Addendum)
Please avoid hot shower/hot baths.  You can use CeraVe or Cetaphil lotion to keep your skin moisturized   1. Need for Tdap vaccination  - Tdap vaccine greater than or equal to 27yo IM  2. Annual physical exam   3. Rash and other nonspecific skin eruption  - triamcinolone cream (KENALOG) 0.1 %; Apply 1 Application topically 2 (two) times daily.  Dispense: 30 g; Refill: 0  4. Anemia, unspecified type  - Iron, TIBC and Ferritin Panel - B12 and Folate Panel     It is important that you exercise regularly at least 30 minutes 5 times a week as tolerated  Think about what you will eat, plan ahead. Choose " clean, green, fresh or frozen" over canned, processed or packaged foods which are more sugary, salty and fatty. 70 to 75% of food eaten should be vegetables and fruit. Three meals at set times with snacks allowed between meals, but they must be fruit or vegetables. Aim to eat over a 12 hour period , example 7 am to 7 pm, and STOP after  your last meal of the day. Drink water,generally about 64 ounces per day, no other drink is as healthy. Fruit juice is best enjoyed in a healthy way, by EATING the fruit.  Thanks for choosing [facility] we consider it a privelige to serve you.

## 2023-06-04 ENCOUNTER — Ambulatory Visit: Payer: No Typology Code available for payment source | Admitting: Pharmacist

## 2023-06-04 LAB — IRON,TIBC AND FERRITIN PANEL
Ferritin: 167 ng/mL (ref 30–400)
Iron Saturation: 26 % (ref 15–55)
Iron: 74 ug/dL (ref 38–169)
Total Iron Binding Capacity: 289 ug/dL (ref 250–450)
UIBC: 215 ug/dL (ref 111–343)

## 2023-06-04 LAB — HIV-1 INTEGRASE GENOTYPE

## 2023-06-04 LAB — HIV RNA, RTPCR W/R GT (RTI, PI,INT)
HIV 1 RNA Quant: 438 {copies}/mL — ABNORMAL HIGH
HIV-1 RNA Quant, Log: 2.64 {Log} — ABNORMAL HIGH

## 2023-06-04 LAB — HIV-1 GENOTYPE: HIV-1 Genotype: DETECTED — AB

## 2023-06-04 LAB — B12 AND FOLATE PANEL
Folate: 6.3 ng/mL (ref >3.0)
Vitamin B-12: 97 pg/mL — ABNORMAL LOW (ref 232–1245)

## 2023-06-05 ENCOUNTER — Other Ambulatory Visit: Payer: Self-pay | Admitting: Nurse Practitioner

## 2023-06-05 DIAGNOSIS — E538 Deficiency of other specified B group vitamins: Secondary | ICD-10-CM

## 2023-06-05 MED ORDER — VITAMIN B-12 1000 MCG PO TABS: 1000.0000 ug | ORAL_TABLET | Freq: Every day | ORAL | 0 refills | Status: DC

## 2023-06-08 NOTE — Progress Notes (Signed)
He is very adamant that he is taking biktarvy every day. I rechecked a geno just to be sure and it is clear. Not sure what to do. I did not identify any drug interactions or anything. Should we add something?

## 2023-06-09 NOTE — Progress Notes (Signed)
Yes, he sees you on 11/20.

## 2023-06-10 ENCOUNTER — Other Ambulatory Visit: Payer: Self-pay

## 2023-06-10 ENCOUNTER — Other Ambulatory Visit (HOSPITAL_COMMUNITY): Payer: Self-pay

## 2023-06-10 ENCOUNTER — Encounter: Payer: Self-pay | Admitting: Pharmacist

## 2023-06-11 ENCOUNTER — Encounter: Payer: Self-pay | Admitting: Infectious Disease

## 2023-06-12 ENCOUNTER — Other Ambulatory Visit: Payer: Self-pay

## 2023-06-12 NOTE — Progress Notes (Signed)
Specialty Pharmacy Refill Coordination Note  Tauno Falotico Kendzierski is a 27 y.o. male contacted today regarding refills of specialty medication(s) Bictegravir-Emtricitab-Tenofov   Patient requested Delivery   Delivery date: 06/19/23   Verified address: [address] [place] [address]   Medication will be filled on 06/18/23.

## 2023-06-25 ENCOUNTER — Other Ambulatory Visit (HOSPITAL_COMMUNITY): Payer: Self-pay

## 2023-07-01 ENCOUNTER — Other Ambulatory Visit: Payer: Self-pay

## 2023-07-01 ENCOUNTER — Ambulatory Visit (INDEPENDENT_AMBULATORY_CARE_PROVIDER_SITE_OTHER): Payer: No Typology Code available for payment source | Admitting: Infectious Disease

## 2023-07-01 ENCOUNTER — Encounter: Payer: Self-pay | Admitting: Infectious Disease

## 2023-07-01 VITALS — BP 142/87 | HR 66 | Temp 97.6°F | Ht 68.0 in | Wt 295.0 lb

## 2023-07-01 DIAGNOSIS — B20 Human immunodeficiency virus [HIV] disease: Secondary | ICD-10-CM

## 2023-07-01 DIAGNOSIS — Z23 Encounter for immunization: Secondary | ICD-10-CM

## 2023-07-01 DIAGNOSIS — B59 Pneumocystosis: Secondary | ICD-10-CM

## 2023-07-01 DIAGNOSIS — R7303 Prediabetes: Secondary | ICD-10-CM

## 2023-07-01 DIAGNOSIS — Z7185 Encounter for immunization safety counseling: Secondary | ICD-10-CM

## 2023-07-01 DIAGNOSIS — I10 Essential (primary) hypertension: Secondary | ICD-10-CM

## 2023-07-01 NOTE — Progress Notes (Signed)
Subjective:   Chief complaint follow-up for HIV disease on medications   Patient ID: Marvin Miller, male    DOB: [birth year], 27 y.o.   MRN: [id number]  HPI  Discussed the use of AI scribe software for clinical note transcription with the patient, who gave verbal consent to proceed.  History of Present Illness   The patient, with a history of HIV and Pneumocystis pneumonia prophylaxis, presents for a follow-up visit. They are currently on Biktarvy and dapsone, having switched from Bactrim due to a suspected drug rash. The rash has since resolved. Despite adherence to the antiretroviral therapy, the patient's viral load has not decreased as expected over the past three months. The patient denies missing any doses of their medication. They also report a history of low vitamin B12 levels and borderline hypertension. The patient's family has a history of hypertension.       Past Medical History:  Diagnosis Date   AIDS (acquired immune deficiency syndrome) (HCC)    Asthma    Obesity    PCP (pneumocystis carinii pneumonia) (HCC)    Prediabetes    Vaccine counseling 04/15/2023    Past Surgical History:  Procedure Laterality Date   ADENOIDECTOMY     TONSILLECTOMY     TYMPANOPLASTY      Family History  Problem Relation Age of Onset   Hypertension Mother    Asthma Mother    Heart attack Father    Diabetes Father    Heart attack Paternal Grandfather    Cancer Neg Hx       Social History   Socioeconomic History   Marital status: Single    Spouse name: Not on file   Number of children: Not on file   Years of education: Not on file   Highest education level: 12th grade  Occupational History   Not on file  Tobacco Use   Smoking status: Former    Types: Cigarettes    Passive exposure: Past   Smokeless tobacco: Never  Vaping Use   Vaping status: Never Used  Substance and Sexual Activity   Alcohol use: No   Drug use: No   Sexual activity: Not Currently  Other Topics  Concern   Not on file  Social History Narrative   Lives home alone    Social Determinants of Health   Financial Resource Strain: Low Risk  (06/03/2023)   Overall Financial Resource Strain (CARDIA)    Difficulty of Paying Living Expenses: Not hard at all  Food Insecurity: No Food Insecurity (06/03/2023)   Hunger Vital Sign    Worried About Running Out of Food in the Last Year: Never true    Ran Out of Food in the Last Year: Never true  Transportation Needs: No Transportation Needs (06/03/2023)   PRAPARE - Administrator, Civil Service (Medical): No    Lack of Transportation (Non-Medical): No  Physical Activity: Unknown (06/03/2023)   Exercise Vital Sign    Days of Exercise per Week: 0 days    Minutes of Exercise per Session: Not on file  Stress: Stress Concern Present (06/03/2023)   [employer] of Occupational Health - Occupational Stress Questionnaire    Feeling of Stress : To some extent  Social Connections: Unknown (06/03/2023)   Social Connection and Isolation Panel [NHANES]    Frequency of Communication with Friends and Family: More than three times a week    Frequency of Social Gatherings with Friends and Family: Once a week  Attends Religious Services: Patient declined    Active Member of Clubs or Organizations: No    Attends Engineer, structural: Not on file    Marital Status: Never married    No Known Allergies   Current Outpatient Medications:    albuterol (VENTOLIN HFA) 108 (90 Base) MCG/ACT inhaler, Inhale 2 puffs into the lungs every 6 (six) hours as needed for wheezing or shortness of breath., Disp: 6.7 g, Rfl: 11   bictegravir-emtricitabine-tenofovir AF (BIKTARVY) 50-200-25 MG TABS tablet, Take 1 tablet by mouth daily., Disp: 30 tablet, Rfl: 11   cyanocobalamin (VITAMIN B12) 1000 MCG tablet, Take 1 tablet (1,000 mcg total) by mouth daily., Disp: 90 tablet, Rfl: 0   dapsone 100 MG tablet, Take 1 tablet (100 mg total) by mouth daily.,  Disp: 30 tablet, Rfl: 11   triamcinolone cream (KENALOG) 0.1 %, Apply 1 Application topically 2 (two) times daily., Disp: 30 g, Rfl: 0   hydrOXYzine (VISTARIL) 25 MG capsule, Take 1 capsule (25 mg total) by mouth 3 (three) times daily as needed. (Patient not taking: Reported on 07/01/2023), Disp: 30 capsule, Rfl: 0   Review of Systems  Constitutional:  Negative for activity change, appetite change, chills, diaphoresis, fatigue, fever and unexpected weight change.  HENT:  Negative for congestion, rhinorrhea, sinus pressure, sneezing, sore throat and trouble swallowing.   Eyes:  Negative for photophobia and visual disturbance.  Respiratory:  Negative for cough, chest tightness, shortness of breath, wheezing and stridor.   Cardiovascular:  Negative for chest pain, palpitations and leg swelling.  Gastrointestinal:  Negative for abdominal distention, abdominal pain, anal bleeding, blood in stool, constipation, diarrhea, nausea and vomiting.  Genitourinary:  Negative for difficulty urinating, dysuria, flank pain and hematuria.  Musculoskeletal:  Negative for arthralgias, back pain, gait problem, joint swelling and myalgias.  Skin:  Negative for color change, pallor, rash and wound.  Neurological:  Negative for dizziness, tremors, weakness and light-headedness.  Hematological:  Negative for adenopathy. Does not bruise/bleed easily.  Psychiatric/Behavioral:  Negative for agitation, behavioral problems, confusion, decreased concentration, dysphoric mood and sleep disturbance.        Objective:   Physical Exam Constitutional:      Appearance: He is well-developed.  HENT:     Head: Normocephalic and atraumatic.  Eyes:     Conjunctiva/sclera: Conjunctivae normal.  Cardiovascular:     Rate and Rhythm: Normal rate and regular rhythm.  Pulmonary:     Effort: Pulmonary effort is normal. No respiratory distress.     Breath sounds: No wheezing.  Abdominal:     General: There is no distension.      Palpations: Abdomen is soft.  Musculoskeletal:        General: No tenderness. Normal range of motion.     Cervical back: Normal range of motion and neck supple.  Skin:    General: Skin is warm and dry.     Coloration: Skin is not pale.     Findings: No erythema or rash.  Neurological:     General: No focal deficit present.     Mental Status: He is alert and oriented to person, place, and time.  Psychiatric:        Mood and Affect: Mood normal.        Behavior: Behavior normal.        Thought Content: Thought content normal.        Judgment: Judgment normal.           Assessment & Plan:  Assessment and Plan    HIV Viral load not suppressed as expected despite adherence to Biktarvy and no resistance detected. Discussed the high barrier to resistance of Biktarvy and the possibility of adding Rukobia to speed up CD4 count increase. -Continue Biktarvy and Dapsone. -Check viral load and CD4 count today. -Consider adding Rukobia depending on lab results and insurance coverage--would argue utility in raising VL and adding another agent given lack of suppression on Biktarvy--though resistance wise he SHOULD suppress on Biktarvy  Borderline Hypertension Noted elevated blood pressure readings. Discussed the importance of monitoring blood pressure at home and the potential genetic predisposition. -Monitor blood pressure at home. -Consider further evaluation and management if consistently elevated readings.  Vaccinations Up to date on COVID and flu vaccines. Discussed the need for a second Hepatitis B vaccine. -Check records for Hepatitis B vaccine status and administer second dose if due.  Follow-up in early to mid-January before patient's trip to [place]

## 2023-07-02 LAB — T-HELPER CELLS (CD4) COUNT (NOT AT ARMC)
CD4 % Helper T Cell: 11 % — ABNORMAL LOW (ref 33–65)
CD4 T Cell Abs: 192 /uL — ABNORMAL LOW (ref 400–1790)

## 2023-07-07 LAB — HIV 1 RNA, QL RT PCR: HIV-1 RNA, Qualitative, TMA: DETECTED — AB

## 2023-07-07 LAB — IGG, IGA, IGM
IgG (Immunoglobin G), Serum: 1347 mg/dL (ref 600–1640)
IgM, Serum: 75 mg/dL (ref 50–300)
Immunoglobulin A: 129 mg/dL (ref 47–310)

## 2023-07-07 LAB — HIV ANTIBODY (ROUTINE TESTING W REFLEX): HIV 1&2 Ab, 4th Generation: REACTIVE — AB

## 2023-07-07 LAB — HIV-1/2 AB - DIFFERENTIATION
HIV-1 antibody: NEGATIVE
HIV-2 Ab: NEGATIVE

## 2023-07-07 LAB — HIV RNA, RTPCR W/R GT (RTI, PI,INT)
HIV 1 RNA Quant: 160 {copies}/mL — ABNORMAL HIGH
HIV-1 RNA Quant, Log: 2.2 {Log_copies}/mL — ABNORMAL HIGH

## 2023-07-13 ENCOUNTER — Other Ambulatory Visit: Payer: Self-pay

## 2023-07-13 ENCOUNTER — Other Ambulatory Visit (HOSPITAL_COMMUNITY): Payer: Self-pay

## 2023-07-13 ENCOUNTER — Ambulatory Visit: Payer: Self-pay | Admitting: Infectious Disease

## 2023-07-13 NOTE — Progress Notes (Signed)
Specialty Pharmacy Ongoing Clinical Assessment Note  Marvin Miller is a 27 y.o. male who is being followed by the specialty pharmacy service for RxSp HIV   Patient's specialty medication(s) reviewed today: Bictegravir-Emtricitab-Tenofov   Missed doses in the last 4 weeks: 0   Patient/Caregiver did not have any additional questions or concerns.   Therapeutic benefit summary: Patient is achieving benefit   Adverse events/side effects summary: No adverse events/side effects   Patient's therapy is appropriate to: Continue    Goals Addressed             This Visit's Progress    Achieve Undetectable HIV Viral Load < 20       Patient is on track. Patient will maintain adherence         Follow up:  6 months  Bobette Mo Specialty Pharmacist

## 2023-07-13 NOTE — Progress Notes (Signed)
Specialty Pharmacy Refill Coordination Note  Marvin Miller is a 27 y.o. male contacted today regarding refills of specialty medication(s) Bictegravir-Emtricitab-Tenofov  Marvin Miller also requested a refill on Dapsone.   Marvin Miller requested Pickup at [facility] [place] date: 07/13/23   Medication will be filled on 07/13/23.   Marvin Miller will call pharmacy this afternoon to find out if medication is ready for pick up.

## 2023-08-04 ENCOUNTER — Other Ambulatory Visit: Payer: Self-pay

## 2023-08-07 ENCOUNTER — Other Ambulatory Visit: Payer: Self-pay

## 2023-08-10 ENCOUNTER — Other Ambulatory Visit (HOSPITAL_COMMUNITY): Payer: Self-pay | Admitting: Pharmacy Technician

## 2023-08-10 ENCOUNTER — Other Ambulatory Visit (HOSPITAL_COMMUNITY): Payer: Self-pay

## 2023-08-10 NOTE — Progress Notes (Signed)
Specialty Pharmacy Refill Coordination Note  Marvin Miller is a 27 y.o. male contacted today regarding refills of specialty medication(s) Bictegravir-Emtricitab-Tenofov Musician)   Patient requested Delivery   Delivery date: 08/14/23   Verified address: [address]  [place]   Medication will be filled on 08/13/23.

## 2023-08-26 ENCOUNTER — Other Ambulatory Visit: Payer: Self-pay

## 2023-08-26 ENCOUNTER — Ambulatory Visit (INDEPENDENT_AMBULATORY_CARE_PROVIDER_SITE_OTHER): Payer: No Typology Code available for payment source | Admitting: Infectious Disease

## 2023-08-26 ENCOUNTER — Encounter: Payer: Self-pay | Admitting: Infectious Disease

## 2023-08-26 VITALS — BP 154/91 | HR 60 | Temp 97.1°F | Ht 70.0 in | Wt 304.0 lb

## 2023-08-26 DIAGNOSIS — B20 Human immunodeficiency virus [HIV] disease: Secondary | ICD-10-CM

## 2023-08-26 DIAGNOSIS — Z7185 Encounter for immunization safety counseling: Secondary | ICD-10-CM

## 2023-08-26 DIAGNOSIS — Z23 Encounter for immunization: Secondary | ICD-10-CM

## 2023-08-26 DIAGNOSIS — R7303 Prediabetes: Secondary | ICD-10-CM

## 2023-08-26 DIAGNOSIS — B59 Pneumocystosis: Secondary | ICD-10-CM

## 2023-08-26 DIAGNOSIS — Z6841 Body Mass Index (BMI) 40.0 and over, adult: Secondary | ICD-10-CM

## 2023-08-26 NOTE — Addendum Note (Signed)
 Addended by: Gypsy Lesser on: 08/26/2023 04:26 PM   Modules accepted: Orders

## 2023-08-26 NOTE — Addendum Note (Signed)
 Addended by: Rawleigh Cadet T on: 08/26/2023 05:36 PM   Modules accepted: Orders

## 2023-08-26 NOTE — Progress Notes (Signed)
 Subjective:  Chief complaint follow-up for HIV disease on medications   Patient ID: Marvin Miller, male    DOB: April 03, 1996, 28 y.o.   MRN: [id number]  HPI  Marvin Miller is a 28 year old man he was diagnosed with HIV AIDS with PCP pneumonia and his CD4 count that was not detectable.  He was started on Biktarvy  and was also on Bactrim  initially at treatment dose then at preventative dose but then there was suspicion of an adverse drug rash and so now he has ultimately been switched over to dapsone .  His viral load did not suppress his quickly as I would have expected.  In looking back however was apparent that his HIV fourth-generation assay did not have a positive discriminatory assay indicative of him I am not having optimal B-cell function and antibody function.  Certainly we have had another patient in our clinic who failed to make antibodies against HIV for a long time and he took much longer to suppress his virus than our typical patients do.  Marvin Miller starting finally suppresses virus more the way would expect it to be suppressed but has not yet had a positive discriminatory assay.  Blood pressure is also been a bit up in our clinic and he and his mom who accompanied him to clinic today are concerned about hypertension and risk posed as it runs in their family very strongly.     Past Medical History:  Diagnosis Date   AIDS (acquired immune deficiency syndrome) (HCC)    Asthma    Obesity    PCP (pneumocystis carinii pneumonia) (HCC)    Prediabetes    Vaccine counseling 04/15/2023    Past Surgical History:  Procedure Laterality Date   ADENOIDECTOMY     TONSILLECTOMY     TYMPANOPLASTY      Family History  Problem Relation Age of Onset   Hypertension Mother    Asthma Mother    Heart attack Father    Diabetes Father    Heart attack Paternal Grandfather    Cancer Neg Hx       Social History   Socioeconomic History   Marital status: Single    Spouse name: Not on file    Number of children: Not on file   Years of education: Not on file   Highest education level: 12th grade  Occupational History   Not on file  Tobacco Use   Smoking status: Former    Types: Cigarettes    Passive exposure: Past   Smokeless tobacco: Never  Vaping Use   Vaping status: Never Used  Substance and Sexual Activity   Alcohol use: No   Drug use: No    Comment: declined condoms   Sexual activity: Not Currently  Other Topics Concern   Not on file  Social History Narrative   Lives home alone    Social Drivers of Health   Financial Resource Strain: Low Risk  (06/03/2023)   Overall Financial Resource Strain (CARDIA)    Difficulty of Paying Living Expenses: Not hard at all  Food Insecurity: No Food Insecurity (06/03/2023)   Hunger Vital Sign    Worried About Running Out of Food in the Last Year: Never true    Ran Out of Food in the Last Year: Never true  Transportation Needs: No Transportation Needs (06/03/2023)   PRAPARE - Administrator, Civil Service (Medical): No    Lack of Transportation (Non-Medical): No  Physical Activity: Unknown (06/03/2023)   Exercise Vital  Sign    Days of Exercise per Week: 0 days    Minutes of Exercise per Session: Not on file  Stress: Stress Concern Present (06/03/2023)   [employer] of Occupational Health - Occupational Stress Questionnaire    Feeling of Stress : To some extent  Social Connections: Unknown (06/03/2023)   Social Connection and Isolation Panel [NHANES]    Frequency of Communication with Friends and Family: More than three times a week    Frequency of Social Gatherings with Friends and Family: Once a week    Attends Religious Services: Patient declined    Database administrator or Organizations: No    Attends Engineer, structural: Not on file    Marital Status: Never married    No Known Allergies   Current Outpatient Medications:    albuterol  (VENTOLIN  HFA) 108 (90 Base) MCG/ACT inhaler,  Inhale 2 puffs into the lungs every 6 (six) hours as needed for wheezing or shortness of breath., Disp: 6.7 g, Rfl: 11   bictegravir-emtricitabine -tenofovir  AF (BIKTARVY ) 50-200-25 MG TABS tablet, Take 1 tablet by mouth daily., Disp: 30 tablet, Rfl: 11   cyanocobalamin (VITAMIN B12) 1000 MCG tablet, Take 1 tablet (1,000 mcg total) by mouth daily., Disp: 90 tablet, Rfl: 0   dapsone  100 MG tablet, Take 1 tablet (100 mg total) by mouth daily., Disp: 30 tablet, Rfl: 11    Review of Systems  Constitutional:  Negative for activity change, appetite change, chills, diaphoresis, fatigue, fever and unexpected weight change.  HENT:  Negative for congestion, rhinorrhea, sinus pressure, sneezing, sore throat and trouble swallowing.   Eyes:  Negative for photophobia and visual disturbance.  Respiratory:  Negative for cough, chest tightness, shortness of breath, wheezing and stridor.   Cardiovascular:  Negative for chest pain, palpitations and leg swelling.  Gastrointestinal:  Negative for abdominal distention, abdominal pain, anal bleeding, blood in stool, constipation, diarrhea, nausea and vomiting.  Genitourinary:  Negative for difficulty urinating, dysuria, flank pain and hematuria.  Musculoskeletal:  Negative for arthralgias, back pain, gait problem, joint swelling and myalgias.  Skin:  Negative for color change, pallor, rash and wound.  Neurological:  Negative for dizziness, tremors, weakness and light-headedness.  Hematological:  Negative for adenopathy. Does not bruise/bleed easily.  Psychiatric/Behavioral:  Negative for agitation, behavioral problems, confusion, decreased concentration, dysphoric mood and sleep disturbance.        Objective:   Physical Exam Constitutional:      Appearance: He is well-developed.  HENT:     Head: Normocephalic and atraumatic.  Eyes:     Conjunctiva/sclera: Conjunctivae normal.  Cardiovascular:     Rate and Rhythm: Normal rate and regular rhythm.  Pulmonary:      Effort: Pulmonary effort is normal. No respiratory distress.     Breath sounds: No wheezing.  Abdominal:     General: There is no distension.     Palpations: Abdomen is soft.  Musculoskeletal:        General: No tenderness. Normal range of motion.     Cervical back: Normal range of motion and neck supple.  Skin:    General: Skin is warm and dry.     Coloration: Skin is not pale.     Findings: No erythema or rash.  Neurological:     General: No focal deficit present.     Mental Status: He is alert and oriented to person, place, and time.  Psychiatric:        Mood and Affect: Mood normal.  Behavior: Behavior normal.        Thought Content: Thought content normal.        Judgment: Judgment normal.           Assessment & Plan:   HIV disease:  I will add order HIV viral load CD4 count CBC with differential CMP, RPR GC and chlamydia and I will continue  Trystan I Geller's Biktarvy  prescription and dapsone   Also recheck a fourth-generation HIV antibody see if his B-cell function is improving.  Hypertension: Some of this may be whitecoat hypertension he is to go home and use his blood pressure cuff at home and give us  readings through MyChart after taking blood pressure least once a day for the next week or 2  Obesity:  There is risk of weight gain with treatment for HIV and we will have to monitor for this.  Could consider referral to nutritionist or weight loss management person.  Keep an eye on A1c as well.  Vaccine counseling recommended meningococcal vaccine dose #2 which she received

## 2023-09-01 ENCOUNTER — Other Ambulatory Visit (HOSPITAL_COMMUNITY): Payer: Self-pay

## 2023-09-01 ENCOUNTER — Other Ambulatory Visit (HOSPITAL_COMMUNITY): Payer: Self-pay | Admitting: Pharmacy Technician

## 2023-09-01 NOTE — Progress Notes (Signed)
Specialty Pharmacy Refill Coordination Note  Marvin Miller is a 28 y.o. male contacted today regarding refills of specialty medication(s) Bictegravir-Emtricitab-Tenofov Musician)   Patient requested Delivery   Delivery date: 09/10/23   Verified address: Patient address [address]  [place]   Medication will be filled on 09/09/23.

## 2023-09-08 LAB — HIV ANTIBODY (ROUTINE TESTING W REFLEX): HIV 1&2 Ab, 4th Generation: REACTIVE — AB

## 2023-09-08 LAB — CBC WITH DIFFERENTIAL/PLATELET
Absolute Lymphocytes: 2443 {cells}/uL (ref 850–3900)
Absolute Monocytes: 539 {cells}/uL (ref 200–950)
Basophils Absolute: 21 {cells}/uL (ref 0–200)
Basophils Relative: 0.3 %
Eosinophils Absolute: 203 {cells}/uL (ref 15–500)
Eosinophils Relative: 2.9 %
HCT: 38.7 % (ref 38.5–50.0)
Hemoglobin: 13 g/dL — ABNORMAL LOW (ref 13.2–17.1)
MCH: 35.2 pg — ABNORMAL HIGH (ref 27.0–33.0)
MCHC: 33.6 g/dL (ref 32.0–36.0)
MCV: 104.9 fL — ABNORMAL HIGH (ref 80.0–100.0)
MPV: 9.1 fL (ref 7.5–12.5)
Monocytes Relative: 7.7 %
Neutro Abs: 3794 {cells}/uL (ref 1500–7800)
Neutrophils Relative %: 54.2 %
Platelets: 285 10*3/uL (ref 140–400)
RBC: 3.69 10*6/uL — ABNORMAL LOW (ref 4.20–5.80)
RDW: 10.9 % — ABNORMAL LOW (ref 11.0–15.0)
Total Lymphocyte: 34.9 %
WBC: 7 10*3/uL (ref 3.8–10.8)

## 2023-09-08 LAB — HIV-1/2 AB - DIFFERENTIATION
HIV-1 antibody: UNDETERMINED — AB
HIV-2 Ab: NEGATIVE

## 2023-09-08 LAB — HIV-1 RNA QUANT-NO REFLEX-BLD
HIV 1 RNA Quant: 177 {copies}/mL — ABNORMAL HIGH
HIV-1 RNA Quant, Log: 2.25 {Log} — ABNORMAL HIGH

## 2023-09-08 LAB — COMPLETE METABOLIC PANEL WITH GFR
AG Ratio: 2 (calc) (ref 1.0–2.5)
ALT: 12 U/L (ref 9–46)
AST: 13 U/L (ref 10–40)
Albumin: 4.7 g/dL (ref 3.6–5.1)
Alkaline phosphatase (APISO): 43 U/L (ref 36–130)
BUN: 12 mg/dL (ref 7–25)
CO2: 29 mmol/L (ref 20–32)
Calcium: 9.2 mg/dL (ref 8.6–10.3)
Chloride: 102 mmol/L (ref 98–110)
Creat: 0.67 mg/dL (ref 0.60–1.24)
Globulin: 2.4 g/dL (ref 1.9–3.7)
Glucose, Bld: 87 mg/dL (ref 65–99)
Potassium: 4.1 mmol/L (ref 3.5–5.3)
Sodium: 138 mmol/L (ref 135–146)
Total Bilirubin: 0.6 mg/dL (ref 0.2–1.2)
Total Protein: 7.1 g/dL (ref 6.1–8.1)
eGFR: 131 mL/min/{1.73_m2} (ref >=60)

## 2023-09-08 LAB — T-HELPER CELLS (CD4) COUNT (NOT AT ARMC)
Absolute CD4: 307 {cells}/uL — ABNORMAL LOW (ref 490–1740)
CD4 T Helper %: 12 % — ABNORMAL LOW (ref 30–61)
Total lymphocyte count: 2542 {cells}/uL (ref 850–3900)

## 2023-09-08 LAB — HIV-1 RNA, QUALITATIVE, TMA: HIV-1 RNA, Qualitative, TMA: DETECTED — AB

## 2023-09-08 LAB — RPR: RPR Ser Ql: NONREACTIVE

## 2023-09-10 ENCOUNTER — Encounter: Payer: Self-pay | Admitting: Infectious Disease

## 2023-09-10 ENCOUNTER — Other Ambulatory Visit (HOSPITAL_COMMUNITY): Payer: Self-pay

## 2023-09-11 ENCOUNTER — Other Ambulatory Visit: Payer: Self-pay

## 2023-09-11 ENCOUNTER — Other Ambulatory Visit (HOSPITAL_COMMUNITY): Payer: Self-pay

## 2023-09-29 ENCOUNTER — Other Ambulatory Visit: Payer: Self-pay

## 2023-09-29 NOTE — Progress Notes (Signed)
Specialty Pharmacy Refill Coordination Note  [name] is a 28 y.o. male contacted today regarding refills of specialty medication(s) Bictegravir-Emtricitab-Tenofov Musician)   Patient requested Delivery   Delivery date: 10/09/23   Verified address: Patient address [address]  [place]   Medication will be filled on 10/08/23.

## 2023-10-06 ENCOUNTER — Encounter (INDEPENDENT_AMBULATORY_CARE_PROVIDER_SITE_OTHER): Payer: Self-pay

## 2023-10-08 ENCOUNTER — Other Ambulatory Visit: Payer: Self-pay

## 2023-10-11 ENCOUNTER — Encounter: Payer: Self-pay | Admitting: Infectious Disease

## 2023-10-11 DIAGNOSIS — R030 Elevated blood-pressure reading, without diagnosis of hypertension: Secondary | ICD-10-CM | POA: Insufficient documentation

## 2023-10-11 DIAGNOSIS — I10 Essential (primary) hypertension: Secondary | ICD-10-CM | POA: Insufficient documentation

## 2023-10-11 HISTORY — DX: Elevated blood-pressure reading, without diagnosis of hypertension: R03.0

## 2023-10-12 ENCOUNTER — Ambulatory Visit: Payer: No Typology Code available for payment source | Admitting: Infectious Disease

## 2023-10-12 DIAGNOSIS — B20 Human immunodeficiency virus [HIV] disease: Secondary | ICD-10-CM

## 2023-10-12 DIAGNOSIS — R030 Elevated blood-pressure reading, without diagnosis of hypertension: Secondary | ICD-10-CM

## 2023-10-12 DIAGNOSIS — B59 Pneumocystosis: Secondary | ICD-10-CM

## 2023-10-12 DIAGNOSIS — Z7185 Encounter for immunization safety counseling: Secondary | ICD-10-CM

## 2023-10-12 DIAGNOSIS — Z6841 Body Mass Index (BMI) 40.0 and over, adult: Secondary | ICD-10-CM

## 2023-10-29 ENCOUNTER — Other Ambulatory Visit: Payer: Self-pay

## 2023-10-29 NOTE — Progress Notes (Signed)
 Specialty Pharmacy Refill Coordination Note  Marvin Miller is a 28 y.o. male contacted today regarding refills of specialty medication(s) Bictegravir-Emtricitab-Tenofov Musician)   Patient requested (Patient-Rptd) Delivery   Delivery date: 11/03/23   Verified address: (Patient-Rptd) [address] [place] [address]   Medication will be filled on 11/02/23.   Patient was left a voice mail that the initial delivery date of 11/02/23 could not be fulfilled due to insurance, medication will instead be filled on 11/02/23 for a 11/03/23 delivery

## 2023-11-08 NOTE — Progress Notes (Deleted)
 Subjective:  Chief complaint: follow-up for HIV disease on medications   Patient ID: Marvin Miller, male    DOB: 10/27/1995, 28 y.o.   MRN: [id number]  HPI'   28 year old man he was diagnosed with HIV AIDS with PCP pneumonia and his CD4 count that was not detectable.   He was started on Biktarvy and was also on Bactrim initially at treatment dose then at preventative dose but then there was suspicion of an adverse drug rash and so now he has ultimately been switched over to dapsone.   His viral load did not suppress his quickly as I would have expected.  In looking back however was apparent that his HIV fourth-generation assay did not have a positive discriminatory assay indicative of him I am not having optimal B-cell function and antibody function.  Certainly we have had another patient in our clinic who failed to make antibodies against HIV for a long time and he took much longer to suppress his virus than our typical patients do.    Past Medical History:  Diagnosis Date   AIDS (acquired immune deficiency syndrome) (HCC)    Asthma    Elevated BP without diagnosis of hypertension 10/11/2023   Obesity    PCP (pneumocystis carinii pneumonia) (HCC)    Prediabetes    Vaccine counseling 04/15/2023    Past Surgical History:  Procedure Laterality Date   ADENOIDECTOMY     TONSILLECTOMY     TYMPANOPLASTY      Family History  Problem Relation Age of Onset   Hypertension Mother    Asthma Mother    Heart attack Father    Diabetes Father    Heart attack Paternal Grandfather    Cancer Neg Hx       Social History   Socioeconomic History   Marital status: Single    Spouse name: Not on file   Number of children: Not on file   Years of education: Not on file   Highest education level: 12th grade  Occupational History   Not on file  Tobacco Use   Smoking status: Former    Types: Cigarettes    Passive exposure: Past   Smokeless tobacco: Never  Vaping Use   Vaping  status: Never Used  Substance and Sexual Activity   Alcohol use: No   Drug use: No    Comment: declined condoms   Sexual activity: Not Currently  Other Topics Concern   Not on file  Social History Narrative   Lives home alone    Social Drivers of Health   Financial Resource Strain: Low Risk  (06/03/2023)   Overall Financial Resource Strain (CARDIA)    Difficulty of Paying Living Expenses: Not hard at all  Food Insecurity: No Food Insecurity (06/03/2023)   Hunger Vital Sign    Worried About Running Out of Food in the Last Year: Never true    Ran Out of Food in the Last Year: Never true  Transportation Needs: No Transportation Needs (06/03/2023)   PRAPARE - Administrator, Civil Service (Medical): No    Lack of Transportation (Non-Medical): No  Physical Activity: Unknown (06/03/2023)   Exercise Vital Sign    Days of Exercise per Week: 0 days    Minutes of Exercise per Session: Not on file  Stress: Stress Concern Present (06/03/2023)   [employer] of Occupational Health - Occupational Stress Questionnaire    Feeling of Stress : To some extent  Social Connections: Unknown (06/03/2023)  Social Advertising account executive [NHANES]    Frequency of Communication with Friends and Family: More than three times a week    Frequency of Social Gatherings with Friends and Family: Once a week    Attends Religious Services: Patient declined    Database administrator or Organizations: No    Attends Engineer, structural: Not on file    Marital Status: Never married    No Known Allergies   Current Outpatient Medications:    albuterol (VENTOLIN HFA) 108 (90 Base) MCG/ACT inhaler, Inhale 2 puffs into the lungs every 6 (six) hours as needed for wheezing or shortness of breath., Disp: 6.7 g, Rfl: 11   bictegravir-emtricitabine-tenofovir AF (BIKTARVY) 50-200-25 MG TABS tablet, Take 1 tablet by mouth daily., Disp: 30 tablet, Rfl: 11   cyanocobalamin (VITAMIN B12)  1000 MCG tablet, Take 1 tablet (1,000 mcg total) by mouth daily., Disp: 90 tablet, Rfl: 0   dapsone 100 MG tablet, Take 1 tablet (100 mg total) by mouth daily., Disp: 30 tablet, Rfl: 11    Review of Systems     Objective:   Physical Exam        Assessment & Plan:

## 2023-11-09 ENCOUNTER — Other Ambulatory Visit: Payer: Self-pay

## 2023-11-09 ENCOUNTER — Encounter: Payer: Self-pay | Admitting: Pharmacist

## 2023-11-09 ENCOUNTER — Ambulatory Visit: Admitting: Infectious Disease

## 2023-11-09 DIAGNOSIS — Z7185 Encounter for immunization safety counseling: Secondary | ICD-10-CM

## 2023-11-09 DIAGNOSIS — J45909 Unspecified asthma, uncomplicated: Secondary | ICD-10-CM

## 2023-11-09 DIAGNOSIS — B20 Human immunodeficiency virus [HIV] disease: Secondary | ICD-10-CM

## 2023-11-09 DIAGNOSIS — Z6841 Body Mass Index (BMI) 40.0 and over, adult: Secondary | ICD-10-CM

## 2023-11-12 ENCOUNTER — Other Ambulatory Visit: Payer: Self-pay

## 2023-11-12 ENCOUNTER — Other Ambulatory Visit (HOSPITAL_COMMUNITY): Payer: Self-pay

## 2023-11-18 ENCOUNTER — Other Ambulatory Visit: Payer: Self-pay

## 2023-11-23 ENCOUNTER — Other Ambulatory Visit: Payer: Self-pay

## 2023-11-23 ENCOUNTER — Other Ambulatory Visit (HOSPITAL_COMMUNITY): Payer: Self-pay

## 2023-11-23 ENCOUNTER — Encounter: Payer: Self-pay | Admitting: Internal Medicine

## 2023-11-23 ENCOUNTER — Ambulatory Visit (INDEPENDENT_AMBULATORY_CARE_PROVIDER_SITE_OTHER): Admitting: Internal Medicine

## 2023-11-23 VITALS — BP 144/84 | HR 55 | Ht 68.0 in | Wt 312.0 lb

## 2023-11-23 DIAGNOSIS — B20 Human immunodeficiency virus [HIV] disease: Secondary | ICD-10-CM | POA: Diagnosis not present

## 2023-11-23 MED ORDER — DAPSONE 100 MG PO TABS
100.0000 mg | ORAL_TABLET | Freq: Every day | ORAL | 11 refills | Status: DC
  Filled 2023-11-23 – 2023-12-15 (×3): qty 30, 30d supply, fill #0
  Filled 2024-01-11: qty 30, 30d supply, fill #1

## 2023-11-23 MED ORDER — BIKTARVY 50-200-25 MG PO TABS
1.0000 | ORAL_TABLET | Freq: Every day | ORAL | 11 refills | Status: DC
  Filled 2023-11-23 – 2023-11-25 (×4): qty 30, 30d supply, fill #0
  Filled 2023-12-24: qty 30, 30d supply, fill #1

## 2023-11-23 NOTE — Progress Notes (Unsigned)
 [facility]     HPI: Deny I Stillings is a 28 y.o. male presents for HIV management. Today @DATE @: Discussed the use of AI scribe software for clinical note transcription with the patient, who gave verbal consent to proceed.  History of Present Illness     Date of diagnosis ART exposure Past OIs Risk factors: MSM, IVDA, congenital  Partners in last 2months***, in the last 12 months***.  Anal sex receptive***, insertive***. Contraception**** Oral sex, contraception*** Vaginal penile sex, contraception***  Social: Occupation: Housing: Support: Understanding of HIV: Etoh/drug/tobacco use:  Past Medical History:  Diagnosis Date   AIDS (acquired immune deficiency syndrome) (HCC)    Asthma    Elevated BP without diagnosis of hypertension 10/11/2023   Obesity    PCP (pneumocystis carinii pneumonia) (HCC)    Prediabetes    Vaccine counseling 04/15/2023    Past Surgical History:  Procedure Laterality Date   ADENOIDECTOMY     TONSILLECTOMY     TYMPANOPLASTY      Family History  Problem Relation Age of Onset   Hypertension Mother    Asthma Mother    Heart attack Father    Diabetes Father    Heart attack Paternal Grandfather    Cancer Neg Hx    Current Outpatient Medications on File Prior to Visit  Medication Sig Dispense Refill   albuterol (VENTOLIN HFA) 108 (90 Base) MCG/ACT inhaler Inhale 2 puffs into the lungs every 6 (six) hours as needed for wheezing or shortness of breath. 6.7 g 11   bictegravir-emtricitabine-tenofovir AF (BIKTARVY) 50-200-25 MG TABS tablet Take 1 tablet by mouth daily. 30 tablet 11   cyanocobalamin (VITAMIN B12) 1000 MCG tablet Take 1 tablet (1,000 mcg total) by mouth daily. 90 tablet 0   dapsone 100 MG tablet Take 1 tablet (100 mg total) by mouth daily. 30 tablet 11   No current facility-administered medications on file prior to visit.    No Known Allergies    Lab Results HIV 1 RNA Quant  Date Value   08/26/2023 177 Copies/mL (H)  07/01/2023 160 copies/mL (H)  05/25/2023 438 copies/mL (H)   CD4 T Cell Abs (/uL)  Date Value  07/01/2023 192 (L)  05/13/2023 152 (L)  04/15/2023 <35 (L)   No results found for: "HIV1GENOSEQ" Lab Results  Component Value Date   WBC 7.0 08/26/2023   HGB 13.0 (L) 08/26/2023   HCT 38.7 08/26/2023   MCV 104.9 (H) 08/26/2023   PLT 285 08/26/2023    Lab Results  Component Value Date   CREATININE 0.67 08/26/2023   BUN 12 08/26/2023   NA 138 08/26/2023   K 4.1 08/26/2023   CL 102 08/26/2023   CO2 29 08/26/2023   Lab Results  Component Value Date   ALT 12 08/26/2023   AST 13 08/26/2023   ALKPHOS 29 (L) 03/24/2023   BILITOT 0.6 08/26/2023    Lab Results  Component Value Date   CHOL 207 (H) 04/15/2023   TRIG 66 04/15/2023   HDL 48 04/15/2023   LDLCALC 143 (H) 04/15/2023   Lab Results  Component Value Date   HAV Reactive (A) 03/24/2023   Lab Results  Component Value Date   HEPBSAG NON REACTIVE 03/22/2023   Lab Results  Component Value Date   HCVAB NON REACTIVE 03/23/2023   No results found for: "CHLAMYDIAWP", "N" No results found for: "GCPROBEAPT" No results found for: "QUANTGOLD"  Assessment/Plan #HIV Reviewed resistance profile with pharmacy,, no  changes form biktravy needed based on resistance profile. -Discussed that VL may remain low but not non detectavbel on ART. Low level viremia is not an indication to change ART. (Presnts with motheR) -HIV labs today -F/U with DR. Vna Dam in 3 months    #Vaccination COVID Flu Monkeypox PCV Meningitis HepA HEpB Tdap Shingles  #Health maintenance -Quantiferon -RPR -HCV -GC -Lipid -Dysplasia screen F/M -Mammogram  -Colonoscopy    Orlie Bjornstad, MD [facility]

## 2023-11-24 ENCOUNTER — Telehealth: Payer: Self-pay | Admitting: Pharmacist

## 2023-11-24 LAB — T-HELPER CELLS (CD4) COUNT (NOT AT ARMC)
CD4 % Helper T Cell: 14 % — ABNORMAL LOW (ref 33–65)
CD4 T Cell Abs: 277 /uL — ABNORMAL LOW (ref 400–1790)

## 2023-11-24 NOTE — Telephone Encounter (Signed)
 Cumulative HIV Genotype Data  RT Mutations  [id number], [id number]  PI Mutations    Integrase Mutations     Interpretation of Genotype Data per [employer] HIV Drug Resistance Database:  Nucleoside RTIs  Abacavir - susceptible Zidovudine - susceptible Emtricitabine - susceptible Lamivudine - susceptible Tenofovir - susceptible   Non-Nucleoside RTIs  Doravirine - susceptible Efavirenz - susceptible Etravirine - susceptible Nevirapine - susceptible Rilpivirine - susceptible   Protease Inhibitors  Atazanavir - susceptible Darunavir - susceptible Lopinavir - susceptible   Integrase Inhibitors  Bictegravir - susceptible Cabotegravir - susceptible Dolutegravir - susceptible Elvitegravir - susceptible Raltegravir - susceptible   No resistance found on any genotypes.  Mohamedamin Nifong L. Morene Cecilio, PharmD, BCIDP, AAHIVP, CPP Infectious Diseases Clinical Pharmacist Practitioner Clinical Pharmacist Lead, Specialty Pharmacy [facility] 11/24/2023, 3:08 PM

## 2023-11-25 ENCOUNTER — Other Ambulatory Visit: Payer: Self-pay

## 2023-11-25 NOTE — Progress Notes (Signed)
 Specialty Pharmacy Refill Coordination Note  Marvin Miller is a 28 y.o. male contacted today regarding refills of specialty medication(s) Bictegravir-Emtricitab-Tenofov Musician)   Patient requested (Patient-Rptd) Delivery   Delivery date: (Patient-Rptd) 11/27/23   Verified address: (Patient-Rptd) [address] [place] [address]   Medication will be filled on 04.21.25 when insurance will pay. Patient notified via mychart.

## 2023-11-26 ENCOUNTER — Other Ambulatory Visit: Payer: Self-pay

## 2023-11-26 ENCOUNTER — Encounter: Payer: Self-pay | Admitting: Internal Medicine

## 2023-11-26 LAB — COMPLETE METABOLIC PANEL WITHOUT GFR
AG Ratio: 1.8 (calc) (ref 1.0–2.5)
ALT: 14 U/L (ref 9–46)
AST: 16 U/L (ref 10–40)
Albumin: 4.7 g/dL (ref 3.6–5.1)
Alkaline phosphatase (APISO): 53 U/L (ref 36–130)
BUN: 14 mg/dL (ref 7–25)
CO2: 29 mmol/L (ref 20–32)
Calcium: 9.2 mg/dL (ref 8.6–10.3)
Chloride: 103 mmol/L (ref 98–110)
Creat: 0.73 mg/dL (ref 0.60–1.24)
Globulin: 2.6 g/dL (ref 1.9–3.7)
Glucose, Bld: 102 mg/dL — ABNORMAL HIGH (ref 65–99)
Potassium: 4.1 mmol/L (ref 3.5–5.3)
Sodium: 137 mmol/L (ref 135–146)
Total Bilirubin: 0.5 mg/dL (ref 0.2–1.2)
Total Protein: 7.3 g/dL (ref 6.1–8.1)

## 2023-11-26 LAB — CBC WITH DIFFERENTIAL/PLATELET
Absolute Lymphocytes: 2304 {cells}/uL (ref 850–3900)
Absolute Monocytes: 520 {cells}/uL (ref 200–950)
Basophils Absolute: 24 {cells}/uL (ref 0–200)
Basophils Relative: 0.3 %
Eosinophils Absolute: 232 {cells}/uL (ref 15–500)
Eosinophils Relative: 2.9 %
HCT: 39.4 % (ref 38.5–50.0)
Hemoglobin: 13.3 g/dL (ref 13.2–17.1)
MCH: 34.6 pg — ABNORMAL HIGH (ref 27.0–33.0)
MCHC: 33.8 g/dL (ref 32.0–36.0)
MCV: 102.6 fL — ABNORMAL HIGH (ref 80.0–100.0)
MPV: 9.1 fL (ref 7.5–12.5)
Monocytes Relative: 6.5 %
Neutro Abs: 4920 {cells}/uL (ref 1500–7800)
Neutrophils Relative %: 61.5 %
Platelets: 271 10*3/uL (ref 140–400)
RBC: 3.84 10*6/uL — ABNORMAL LOW (ref 4.20–5.80)
RDW: 11.3 % (ref 11.0–15.0)
Total Lymphocyte: 28.8 %
WBC: 8 10*3/uL (ref 3.8–10.8)

## 2023-11-26 LAB — HIV-1 RNA QUANT-NO REFLEX-BLD
HIV 1 RNA Quant: 34 {copies}/mL — ABNORMAL HIGH
HIV-1 RNA Quant, Log: 1.53 {Log_copies}/mL — ABNORMAL HIGH

## 2023-11-26 LAB — HIV-1/2 AB - DIFFERENTIATION
HIV-1 antibody: POSITIVE — AB
HIV-2 Ab: NEGATIVE

## 2023-11-26 LAB — HIV ANTIBODY (ROUTINE TESTING W REFLEX): HIV 1&2 Ab, 4th Generation: REACTIVE — AB

## 2023-11-30 ENCOUNTER — Other Ambulatory Visit: Payer: Self-pay

## 2023-12-14 ENCOUNTER — Other Ambulatory Visit: Payer: Self-pay

## 2023-12-15 ENCOUNTER — Other Ambulatory Visit: Payer: Self-pay

## 2023-12-15 ENCOUNTER — Other Ambulatory Visit (HOSPITAL_COMMUNITY): Payer: Self-pay

## 2023-12-24 ENCOUNTER — Other Ambulatory Visit: Payer: Self-pay

## 2023-12-24 NOTE — Progress Notes (Signed)
 Specialty Pharmacy Refill Coordination Note  Marvin Miller is a 28 y.o. male contacted today regarding refills of specialty medication(s) Bictegravir-Emtricitab-Tenofov (Biktarvy )   Patient requested (Patient-Rptd) Delivery   Delivery date: (Patient-Rptd) 01/04/24   Verified address: (Patient-Rptd) [address] [place], [place] [address]   Medication will be filled on 05.23.25.

## 2023-12-29 ENCOUNTER — Other Ambulatory Visit: Payer: Self-pay

## 2023-12-29 NOTE — Progress Notes (Signed)
 Patient was left a voice mail that their delivery will be rescheduled to 01/01/24 due the [facility] on 01/04/24. Patient was advised to call the specialty pharmacy at [phone] should that day not work out.

## 2023-12-31 ENCOUNTER — Other Ambulatory Visit: Payer: Self-pay

## 2024-01-06 ENCOUNTER — Encounter: Payer: Self-pay | Admitting: Infectious Disease

## 2024-01-11 NOTE — Progress Notes (Signed)
 Chief complaint: follow-up for HIV disease on medications  Subjective:    Patient ID: Marvin Miller, male    DOB: 1995/08/30, 28 y.o.   MRN: [id number]  HPI  Discussed the use of AI scribe software for clinical note transcription with the patient, who gave verbal consent to proceed.  History of Present Illness   Marvin Miller is a 28 year old male with HIV who presents for follow-up regarding viral suppression.  He is managing HIV with Biktarvy , achieving an undetectable viral load of 34. His Western blot turned positive a month ago. CD4 count improved from 192 to 277 over the past six months.  He is considering tattoos and dental procedures, including wisdom teeth extraction, and is concerned about potential interactions with his medications.  No current symptoms of fever, headaches, chest pain, or stroke.      Past Medical History:  Diagnosis Date   AIDS (acquired immune deficiency syndrome) (HCC)    Asthma    Elevated BP without diagnosis of hypertension 10/11/2023   Obesity    PCP (pneumocystis carinii pneumonia) (HCC)    Prediabetes    Vaccine counseling 04/15/2023    Past Surgical History:  Procedure Laterality Date   ADENOIDECTOMY     TONSILLECTOMY     TYMPANOPLASTY      Family History  Problem Relation Age of Onset   Hypertension Mother    Asthma Mother    Heart attack Father    Diabetes Father    Heart attack Paternal Grandfather    Cancer Neg Hx       Social History   Socioeconomic History   Marital status: Single    Spouse name: Not on file   Number of children: Not on file   Years of education: Not on file   Highest education level: 12th grade  Occupational History   Not on file  Tobacco Use   Smoking status: Former    Types: Cigarettes    Passive exposure: Past   Smokeless tobacco: Never  Vaping Use   Vaping status: Never Used  Substance and Sexual Activity   Alcohol use: No   Drug use: No    Comment: declined condoms   Sexual  activity: Not Currently  Other Topics Concern   Not on file  Social History Narrative   Lives home alone    Social Drivers of Health   Financial Resource Strain: Low Risk  (06/03/2023)   Overall Financial Resource Strain (CARDIA)    Difficulty of Paying Living Expenses: Not hard at all  Food Insecurity: No Food Insecurity (06/03/2023)   Hunger Vital Sign    Worried About Running Out of Food in the Last Year: Never true    Ran Out of Food in the Last Year: Never true  Transportation Needs: No Transportation Needs (06/03/2023)   PRAPARE - Administrator, Civil Service (Medical): No    Lack of Transportation (Non-Medical): No  Physical Activity: Unknown (06/03/2023)   Exercise Vital Sign    Days of Exercise per Week: 0 days    Minutes of Exercise per Session: Not on file  Stress: Stress Concern Present (06/03/2023)   [employer] of Occupational Health - Occupational Stress Questionnaire    Feeling of Stress : To some extent  Social Connections: Unknown (06/03/2023)   Social Connection and Isolation Panel [NHANES]    Frequency of Communication with Friends and Family: More than three times a week    Frequency of Social Gatherings with  Friends and Family: Once a week    Attends Religious Services: Patient declined    Database administrator or Organizations: No    Attends Engineer, structural: Not on file    Marital Status: Never married    No Known Allergies   Current Outpatient Medications:    albuterol  (VENTOLIN  HFA) 108 (90 Base) MCG/ACT inhaler, Inhale 2 puffs into the lungs every 6 (six) hours as needed for wheezing or shortness of breath., Disp: 6.7 g, Rfl: 11   bictegravir-emtricitabine -tenofovir  AF (BIKTARVY ) 50-200-25 MG TABS tablet, Take 1 tablet by mouth daily., Disp: 30 tablet, Rfl: 11   cyanocobalamin (VITAMIN B12) 1000 MCG tablet, Take 1 tablet (1,000 mcg total) by mouth daily., Disp: 90 tablet, Rfl: 0   dapsone  100 MG tablet, Take 1  tablet (100 mg total) by mouth daily., Disp: 30 tablet, Rfl: 11   Review of Systems  Constitutional:  Negative for activity change, appetite change, chills, diaphoresis, fatigue, fever and unexpected weight change.  HENT:  Negative for congestion, rhinorrhea, sinus pressure, sneezing, sore throat and trouble swallowing.   Eyes:  Negative for photophobia and visual disturbance.  Respiratory:  Negative for cough, chest tightness, shortness of breath, wheezing and stridor.   Cardiovascular:  Negative for chest pain, palpitations and leg swelling.  Gastrointestinal:  Negative for abdominal distention, abdominal pain, anal bleeding, blood in stool, constipation, diarrhea, nausea and vomiting.  Genitourinary:  Negative for difficulty urinating, dysuria, flank pain and hematuria.  Musculoskeletal:  Negative for arthralgias, back pain, gait problem, joint swelling and myalgias.  Skin:  Negative for color change, pallor, rash and wound.  Neurological:  Negative for dizziness, tremors, weakness and light-headedness.  Hematological:  Negative for adenopathy. Does not bruise/bleed easily.  Psychiatric/Behavioral:  Negative for agitation, behavioral problems, confusion, decreased concentration, dysphoric mood and sleep disturbance.        Objective:   Physical Exam Constitutional:      Appearance: He is well-developed.  HENT:     Head: Normocephalic and atraumatic.  Eyes:     Conjunctiva/sclera: Conjunctivae normal.  Cardiovascular:     Rate and Rhythm: Normal rate and regular rhythm.  Pulmonary:     Effort: Pulmonary effort is normal. No respiratory distress.     Breath sounds: No wheezing.  Abdominal:     General: There is no distension.     Palpations: Abdomen is soft.  Musculoskeletal:        General: No tenderness. Normal range of motion.     Cervical back: Normal range of motion and neck supple.  Skin:    General: Skin is warm and dry.     Coloration: Skin is not pale.     Findings:  No erythema or rash.  Neurological:     General: No focal deficit present.     Mental Status: He is alert and oriented to person, place, and time.  Psychiatric:        Mood and Affect: Mood normal.        Behavior: Behavior normal.        Thought Content: Thought content normal.        Judgment: Judgment normal.           Assessment & Plan:   Assessment and Plan    HIV infection, currently suppressed HIV viral load undetectable, CD4 count improved to 277. Antibody development noted. Medication adherence crucial for suppression.  -- Biktarvy  requires food to avoid mineral interaction (Ca, Mg, Iron). - Check CD4  count today. - Consider discontinuing Dapsone  if CD4 count remains above 200. - Continue Biktarvy  with food to avoid interaction with calcium, magnesium, and iron. - Educated on medication adherence to maintain viral suppression.  Hypertension, whtie coat Blood pressure elevated, no antihypertensives prescribed. Discussed hereditary factors and asymptomatic nature. Emphasized regular monitoring to prevent organ damage. - Monitor blood pressure at home, especially when relaxed. - Consult primary care physician for hypertension management if needed.      HIV disease:  I will add order HIV viral load CD4 count CBC with differential CMP, RPR GC and chlamydia and I will continue  Marvin Miller Biktarvy , prescription

## 2024-01-12 ENCOUNTER — Ambulatory Visit: Admitting: Infectious Disease

## 2024-01-12 ENCOUNTER — Encounter: Payer: Self-pay | Admitting: Infectious Disease

## 2024-01-12 ENCOUNTER — Other Ambulatory Visit: Payer: Self-pay

## 2024-01-12 ENCOUNTER — Other Ambulatory Visit (HOSPITAL_COMMUNITY): Payer: Self-pay

## 2024-01-12 VITALS — BP 146/90 | HR 76 | Temp 99.2°F | Wt 301.0 lb

## 2024-01-12 DIAGNOSIS — B59 Pneumocystosis: Secondary | ICD-10-CM

## 2024-01-12 DIAGNOSIS — R030 Elevated blood-pressure reading, without diagnosis of hypertension: Secondary | ICD-10-CM

## 2024-01-12 DIAGNOSIS — B20 Human immunodeficiency virus [HIV] disease: Secondary | ICD-10-CM

## 2024-01-12 DIAGNOSIS — Z7185 Encounter for immunization safety counseling: Secondary | ICD-10-CM

## 2024-01-12 MED ORDER — BIKTARVY 50-200-25 MG PO TABS
1.0000 | ORAL_TABLET | Freq: Every day | ORAL | 11 refills | Status: DC
  Filled 2024-01-12 – 2024-01-27 (×2): qty 30, 30d supply, fill #0
  Filled 2024-02-24 – 2024-02-26 (×2): qty 30, 30d supply, fill #1
  Filled 2024-03-23 – 2024-05-02 (×6): qty 30, 30d supply, fill #2

## 2024-01-14 LAB — CBC WITH DIFFERENTIAL/PLATELET
Absolute Lymphocytes: 2128 {cells}/uL (ref 850–3900)
Absolute Monocytes: 532 {cells}/uL (ref 200–950)
Basophils Absolute: 23 {cells}/uL (ref 0–200)
Basophils Relative: 0.3 %
Eosinophils Absolute: 99 {cells}/uL (ref 15–500)
Eosinophils Relative: 1.3 %
HCT: 41 % (ref 38.5–50.0)
Hemoglobin: 13.5 g/dL (ref 13.2–17.1)
MCH: 34.4 pg — ABNORMAL HIGH (ref 27.0–33.0)
MCHC: 32.9 g/dL (ref 32.0–36.0)
MCV: 104.3 fL — ABNORMAL HIGH (ref 80.0–100.0)
MPV: 9.6 fL (ref 7.5–12.5)
Monocytes Relative: 7 %
Neutro Abs: 4818 {cells}/uL (ref 1500–7800)
Neutrophils Relative %: 63.4 %
Platelets: 226 10*3/uL (ref 140–400)
RBC: 3.93 10*6/uL — ABNORMAL LOW (ref 4.20–5.80)
RDW: 11.9 % (ref 11.0–15.0)
Total Lymphocyte: 28 %
WBC: 7.6 10*3/uL (ref 3.8–10.8)

## 2024-01-14 LAB — COMPLETE METABOLIC PANEL WITHOUT GFR
AG Ratio: 1.9 (calc) (ref 1.0–2.5)
ALT: 22 U/L (ref 9–46)
AST: 22 U/L (ref 10–40)
Albumin: 4.6 g/dL (ref 3.6–5.1)
Alkaline phosphatase (APISO): 44 U/L (ref 36–130)
BUN: 10 mg/dL (ref 7–25)
CO2: 26 mmol/L (ref 20–32)
Calcium: 9.4 mg/dL (ref 8.6–10.3)
Chloride: 103 mmol/L (ref 98–110)
Creat: 0.7 mg/dL (ref 0.60–1.24)
Globulin: 2.4 g/dL (ref 1.9–3.7)
Glucose, Bld: 103 mg/dL — ABNORMAL HIGH (ref 65–99)
Potassium: 3.4 mmol/L — ABNORMAL LOW (ref 3.5–5.3)
Sodium: 140 mmol/L (ref 135–146)
Total Bilirubin: 0.8 mg/dL (ref 0.2–1.2)
Total Protein: 7 g/dL (ref 6.1–8.1)

## 2024-01-14 LAB — T-HELPER CELLS (CD4) COUNT (NOT AT ARMC)
Absolute CD4: 353 {cells}/uL — ABNORMAL LOW (ref 490–1740)
CD4 T Helper %: 15 % — ABNORMAL LOW (ref 30–61)
Total lymphocyte count: 2280 {cells}/uL (ref 850–3900)

## 2024-01-14 LAB — HIV-1 RNA QUANT-NO REFLEX-BLD
HIV 1 RNA Quant: 33 {copies}/mL — ABNORMAL HIGH
HIV-1 RNA Quant, Log: 1.52 {Log_copies}/mL — ABNORMAL HIGH

## 2024-01-14 LAB — RPR: RPR Ser Ql: NONREACTIVE

## 2024-01-26 ENCOUNTER — Other Ambulatory Visit: Payer: Self-pay

## 2024-01-27 ENCOUNTER — Other Ambulatory Visit: Payer: Self-pay

## 2024-01-27 NOTE — Progress Notes (Signed)
 Specialty Pharmacy Refill Coordination Note  Marvin Miller is a 28 y.o. male contacted today regarding refills of specialty medication(s) Bictegravir-Emtricitab-Tenofov (Biktarvy )   Patient requested Delivery   Delivery date: 02/01/24   Verified address: [address] [place], [place] [address]   Medication will be filled on 01/29/24.

## 2024-01-27 NOTE — Progress Notes (Signed)
 Specialty Pharmacy Ongoing Clinical Assessment Note  Marvin Miller is a 28 y.o. male who is being followed by the specialty pharmacy service for RxSp HIV   Patient's specialty medication(s) reviewed today: Bictegravir-Emtricitab-Tenofov (Biktarvy )   Missed doses in the last 4 weeks: 0   Patient/Caregiver did not have any additional questions or concerns.   Therapeutic benefit summary: Patient is achieving benefit   Adverse events/side effects summary: No adverse events/side effects   Patient's therapy is appropriate to: Continue    Goals Addressed             This Visit's Progress    Achieve Undetectable HIV Viral Load < 20   On track    Patient is on track. Patient will maintain adherence. Most recent VL further decreased to 33 on 01/12/24.          Follow up: 6 months  Berna Gitto M Lamaya Hyneman Specialty Pharmacist

## 2024-01-29 ENCOUNTER — Other Ambulatory Visit: Payer: Self-pay

## 2024-02-09 ENCOUNTER — Encounter: Payer: Self-pay | Admitting: Infectious Disease

## 2024-02-24 ENCOUNTER — Other Ambulatory Visit: Payer: Self-pay

## 2024-02-26 ENCOUNTER — Other Ambulatory Visit: Payer: Self-pay | Admitting: Pharmacy Technician

## 2024-02-26 ENCOUNTER — Other Ambulatory Visit: Payer: Self-pay

## 2024-02-26 NOTE — Progress Notes (Signed)
 Specialty Pharmacy Refill Coordination Note  Marvin Miller is a 28 y.o. male contacted today regarding refills of specialty medication(s) Bictegravir-Emtricitab-Tenofov (Biktarvy )   Patient requested Delivery   Delivery date: 03/01/24   Verified address: [address] [place] [address]   Medication will be filled on 02/29/24.

## 2024-02-29 ENCOUNTER — Other Ambulatory Visit: Payer: Self-pay

## 2024-03-23 ENCOUNTER — Other Ambulatory Visit: Payer: Self-pay

## 2024-03-25 ENCOUNTER — Other Ambulatory Visit: Payer: Self-pay

## 2024-04-10 NOTE — Progress Notes (Deleted)
 Subjective:   Chief complaint: follow-up for HIV disease on medications    Patient ID: Marvin Miller, male    DOB: 1995-10-13, 28 y.o.   MRN: [id number]  HPI   Past Medical History:  Diagnosis Date   AIDS (acquired immune deficiency syndrome) (HCC)    Asthma    Elevated BP without diagnosis of hypertension 10/11/2023   Obesity    PCP (pneumocystis carinii pneumonia) (HCC)    Prediabetes    Vaccine counseling 04/15/2023    Past Surgical History:  Procedure Laterality Date   ADENOIDECTOMY     TONSILLECTOMY     TYMPANOPLASTY      Family History  Problem Relation Age of Onset   Hypertension Mother    Asthma Mother    Heart attack Father    Diabetes Father    Heart attack Paternal Grandfather    Cancer Neg Hx       Social History   Socioeconomic History   Marital status: Single    Spouse name: Not on file   Number of children: Not on file   Years of education: Not on file   Highest education level: 12th grade  Occupational History   Not on file  Tobacco Use   Smoking status: Former    Types: Cigarettes    Passive exposure: Past   Smokeless tobacco: Never  Vaping Use   Vaping status: Never Used  Substance and Sexual Activity   Alcohol use: No   Drug use: No    Comment: declined condoms   Sexual activity: Not Currently  Other Topics Concern   Not on file  Social History Narrative   Lives home alone    Social Drivers of Health   Financial Resource Strain: Low Risk  (06/03/2023)   Overall Financial Resource Strain (CARDIA)    Difficulty of Paying Living Expenses: Not hard at all  Food Insecurity: No Food Insecurity (06/03/2023)   Hunger Vital Sign    Worried About Running Out of Food in the Last Year: Never true    Ran Out of Food in the Last Year: Never true  Transportation Needs: No Transportation Needs (06/03/2023)   PRAPARE - Administrator, Civil Service (Medical): No    Lack of Transportation (Non-Medical): No  Physical  Activity: Unknown (06/03/2023)   Exercise Vital Sign    Days of Exercise per Week: 0 days    Minutes of Exercise per Session: Not on file  Stress: Stress Concern Present (06/03/2023)   [employer] of Occupational Health - Occupational Stress Questionnaire    Feeling of Stress : To some extent  Social Connections: Unknown (06/03/2023)   Social Connection and Isolation Panel    Frequency of Communication with Friends and Family: More than three times a week    Frequency of Social Gatherings with Friends and Family: Once a week    Attends Religious Services: Patient declined    Database administrator or Organizations: No    Attends Engineer, structural: Not on file    Marital Status: Never married    No Known Allergies   Current Outpatient Medications:    albuterol  (VENTOLIN  HFA) 108 (90 Base) MCG/ACT inhaler, Inhale 2 puffs into the lungs every 6 (six) hours as needed for wheezing or shortness of breath., Disp: 6.7 g, Rfl: 11   bictegravir-emtricitabine -tenofovir  AF (BIKTARVY ) 50-200-25 MG TABS tablet, Take 1 tablet by mouth daily., Disp: 30 tablet, Rfl: 11   cyanocobalamin (VITAMIN B12) 1000 MCG  tablet, Take 1 tablet (1,000 mcg total) by mouth daily. (Patient not taking: Reported on 01/12/2024), Disp: 90 tablet, Rfl: 0   dapsone  100 MG tablet, Take 1 tablet (100 mg total) by mouth daily., Disp: 30 tablet, Rfl: 11   Review of Systems     Objective:   Physical Exam        Assessment & Plan:

## 2024-04-12 ENCOUNTER — Ambulatory Visit: Payer: Self-pay | Admitting: Infectious Disease

## 2024-04-12 DIAGNOSIS — B20 Human immunodeficiency virus [HIV] disease: Secondary | ICD-10-CM

## 2024-04-12 DIAGNOSIS — Z7185 Encounter for immunization safety counseling: Secondary | ICD-10-CM

## 2024-04-19 ENCOUNTER — Other Ambulatory Visit (HOSPITAL_COMMUNITY): Payer: Self-pay

## 2024-05-02 ENCOUNTER — Other Ambulatory Visit (HOSPITAL_COMMUNITY): Payer: Self-pay

## 2024-05-02 ENCOUNTER — Other Ambulatory Visit: Payer: Self-pay

## 2024-05-10 NOTE — Progress Notes (Unsigned)
 Subjective:  Chief complaint: follow-up for HIV disease on medications   Patient ID: Marvin Miller, male    DOB: June 04, 1996, 28 y.o.   MRN: [id number]  HPI  Past Medical History:  Diagnosis Date   AIDS (acquired immune deficiency syndrome) (HCC)    Asthma    Elevated BP without diagnosis of hypertension 10/11/2023   Obesity    PCP (pneumocystis carinii pneumonia) (HCC)    Prediabetes    Vaccine counseling 04/15/2023    Past Surgical History:  Procedure Laterality Date   ADENOIDECTOMY     TONSILLECTOMY     TYMPANOPLASTY      Family History  Problem Relation Age of Onset   Hypertension Mother    Asthma Mother    Heart attack Father    Diabetes Father    Heart attack Paternal Grandfather    Cancer Neg Hx       Social History   Socioeconomic History   Marital status: Single    Spouse name: Not on file   Number of children: Not on file   Years of education: Not on file   Highest education level: 12th grade  Occupational History   Not on file  Tobacco Use   Smoking status: Former    Types: Cigarettes    Passive exposure: Past   Smokeless tobacco: Never  Vaping Use   Vaping status: Never Used  Substance and Sexual Activity   Alcohol use: No   Drug use: No    Comment: declined condoms   Sexual activity: Not Currently  Other Topics Concern   Not on file  Social History Narrative   Lives home alone    Social Drivers of Health   Financial Resource Strain: Low Risk  (06/03/2023)   Overall Financial Resource Strain (CARDIA)    Difficulty of Paying Living Expenses: Not hard at all  Food Insecurity: No Food Insecurity (06/03/2023)   Hunger Vital Sign    Worried About Running Out of Food in the Last Year: Never true    Ran Out of Food in the Last Year: Never true  Transportation Needs: No Transportation Needs (06/03/2023)   PRAPARE - Administrator, Civil Service (Medical): No    Lack of Transportation (Non-Medical): No  Physical Activity:  Unknown (06/03/2023)   Exercise Vital Sign    Days of Exercise per Week: 0 days    Minutes of Exercise per Session: Not on file  Stress: Stress Concern Present (06/03/2023)   [employer] of Occupational Health - Occupational Stress Questionnaire    Feeling of Stress : To some extent  Social Connections: Unknown (06/03/2023)   Social Connection and Isolation Panel    Frequency of Communication with Friends and Family: More than three times a week    Frequency of Social Gatherings with Friends and Family: Once a week    Attends Religious Services: Patient declined    Database administrator or Organizations: No    Attends Engineer, structural: Not on file    Marital Status: Never married    No Known Allergies   Current Outpatient Medications:    albuterol  (VENTOLIN  HFA) 108 (90 Base) MCG/ACT inhaler, Inhale 2 puffs into the lungs every 6 (six) hours as needed for wheezing or shortness of breath., Disp: 6.7 g, Rfl: 11   bictegravir-emtricitabine -tenofovir  AF (BIKTARVY ) 50-200-25 MG TABS tablet, Take 1 tablet by mouth daily., Disp: 30 tablet, Rfl: 11   cyanocobalamin (VITAMIN B12) 1000 MCG tablet, Take 1  tablet (1,000 mcg total) by mouth daily. (Patient not taking: Reported on 01/12/2024), Disp: 90 tablet, Rfl: 0   dapsone  100 MG tablet, Take 1 tablet (100 mg total) by mouth daily., Disp: 30 tablet, Rfl: 11    Review of Systems     Objective:   Physical Exam        Assessment & Plan:

## 2024-05-11 ENCOUNTER — Other Ambulatory Visit (HOSPITAL_COMMUNITY)
Admission: RE | Admit: 2024-05-11 | Discharge: 2024-05-11 | Disposition: A | Discharge status: Home / Self Care | Source: Ambulatory Visit | Attending: Infectious Disease | Admitting: Infectious Disease

## 2024-05-11 ENCOUNTER — Other Ambulatory Visit (HOSPITAL_COMMUNITY): Payer: Self-pay

## 2024-05-11 ENCOUNTER — Other Ambulatory Visit: Payer: Self-pay

## 2024-05-11 ENCOUNTER — Ambulatory Visit (INDEPENDENT_AMBULATORY_CARE_PROVIDER_SITE_OTHER): Admitting: Infectious Disease

## 2024-05-11 VITALS — BP 152/90 | HR 66 | Temp 98.7°F | Wt 302.0 lb

## 2024-05-11 DIAGNOSIS — Z79899 Other long term (current) drug therapy: Secondary | ICD-10-CM | POA: Diagnosis not present

## 2024-05-11 DIAGNOSIS — Z23 Encounter for immunization: Secondary | ICD-10-CM

## 2024-05-11 DIAGNOSIS — Z7185 Encounter for immunization safety counseling: Secondary | ICD-10-CM | POA: Insufficient documentation

## 2024-05-11 DIAGNOSIS — B20 Human immunodeficiency virus [HIV] disease: Secondary | ICD-10-CM

## 2024-05-11 DIAGNOSIS — Z6841 Body Mass Index (BMI) 40.0 and over, adult: Secondary | ICD-10-CM | POA: Insufficient documentation

## 2024-05-11 MED ORDER — BIKTARVY 50-200-25 MG PO TABS
1.0000 | ORAL_TABLET | Freq: Every day | ORAL | 11 refills | Status: AC
  Filled 2024-05-11 – 2024-05-25 (×2): qty 30, 30d supply, fill #0
  Filled 2024-06-23: qty 30, 30d supply, fill #1
  Filled 2024-07-22: qty 30, 30d supply, fill #2
  Filled 2024-08-18: qty 30, 30d supply, fill #3
  Filled 2024-09-15: qty 30, 30d supply, fill #4

## 2024-05-11 MED ORDER — ALBUTEROL SULFATE HFA 108 (90 BASE) MCG/ACT IN AERS: 2.0000 | INHALATION_SPRAY | Freq: Four times a day (QID) | RESPIRATORY_TRACT | 11 refills | Status: AC | PRN

## 2024-05-12 LAB — T-HELPER CELLS (CD4) COUNT (NOT AT ARMC)
CD4 % Helper T Cell: 15 % — ABNORMAL LOW (ref 33–65)
CD4 T Cell Abs: 364 /uL — ABNORMAL LOW (ref 400–1790)

## 2024-05-12 LAB — URINE CYTOLOGY ANCILLARY ONLY
Chlamydia: NEGATIVE
Comment: NEGATIVE
Comment: NORMAL
Neisseria Gonorrhea: NEGATIVE

## 2024-05-13 LAB — LIPID PANEL
Cholesterol: 176 mg/dL (ref <200)
HDL: 39 mg/dL — ABNORMAL LOW (ref >=40)
LDL Cholesterol (Calc): 124 mg/dL — ABNORMAL HIGH
Non-HDL Cholesterol (Calc): 137 mg/dL — ABNORMAL HIGH (ref <130)
Total CHOL/HDL Ratio: 4.5 (calc) (ref <5.0)
Triglycerides: 44 mg/dL (ref <150)

## 2024-05-13 LAB — COMPLETE METABOLIC PANEL WITHOUT GFR
AG Ratio: 1.8 (calc) (ref 1.0–2.5)
ALT: 14 U/L (ref 9–46)
AST: 12 U/L (ref 10–40)
Albumin: 4.8 g/dL (ref 3.6–5.1)
Alkaline phosphatase (APISO): 50 U/L (ref 36–130)
BUN: 14 mg/dL (ref 7–25)
CO2: 28 mmol/L (ref 20–32)
Calcium: 9.7 mg/dL (ref 8.6–10.3)
Chloride: 104 mmol/L (ref 98–110)
Creat: 0.72 mg/dL (ref 0.60–1.24)
Globulin: 2.6 g/dL (ref 1.9–3.7)
Glucose, Bld: 95 mg/dL (ref 65–99)
Potassium: 4.5 mmol/L (ref 3.5–5.3)
Sodium: 141 mmol/L (ref 135–146)
Total Bilirubin: 0.4 mg/dL (ref 0.2–1.2)
Total Protein: 7.4 g/dL (ref 6.1–8.1)

## 2024-05-13 LAB — CBC WITH DIFFERENTIAL/PLATELET
Absolute Lymphocytes: 2490 {cells}/uL (ref 850–3900)
Absolute Monocytes: 440 {cells}/uL (ref 200–950)
Basophils Absolute: 33 {cells}/uL (ref 0–200)
Basophils Relative: 0.4 %
Eosinophils Absolute: 291 {cells}/uL (ref 15–500)
Eosinophils Relative: 3.5 %
HCT: 44.1 % (ref 38.5–50.0)
Hemoglobin: 15 g/dL (ref 13.2–17.1)
MCH: 32.8 pg (ref 27.0–33.0)
MCHC: 34 g/dL (ref 32.0–36.0)
MCV: 96.3 fL (ref 80.0–100.0)
MPV: 8.8 fL (ref 7.5–12.5)
Monocytes Relative: 5.3 %
Neutro Abs: 5046 {cells}/uL (ref 1500–7800)
Neutrophils Relative %: 60.8 %
Platelets: 278 Thousand/uL (ref 140–400)
RBC: 4.58 Million/uL (ref 4.20–5.80)
RDW: 11.8 % (ref 11.0–15.0)
Total Lymphocyte: 30 %
WBC: 8.3 Thousand/uL (ref 3.8–10.8)

## 2024-05-13 LAB — RPR: RPR Ser Ql: NONREACTIVE

## 2024-05-13 LAB — HIV-1 RNA QUANT-NO REFLEX-BLD
HIV 1 RNA Quant: 48 {copies}/mL — ABNORMAL HIGH
HIV-1 RNA Quant, Log: 1.68 {Log_copies}/mL — ABNORMAL HIGH

## 2024-05-25 ENCOUNTER — Encounter (INDEPENDENT_AMBULATORY_CARE_PROVIDER_SITE_OTHER): Payer: Self-pay

## 2024-05-25 ENCOUNTER — Other Ambulatory Visit (HOSPITAL_COMMUNITY): Payer: Self-pay

## 2024-05-25 ENCOUNTER — Encounter: Payer: Self-pay | Admitting: Infectious Disease

## 2024-05-25 ENCOUNTER — Other Ambulatory Visit: Payer: Self-pay

## 2024-05-25 NOTE — Progress Notes (Signed)
 Specialty Pharmacy Refill Coordination Note  Marvin Miller is a 28 y.o. male contacted today regarding refills of specialty medication(s) Bictegravir-Emtricitab-Tenofov (Biktarvy )   Patient requested Delivery   Delivery date: 05/27/24   Verified address: [address] [place] [address]   Medication will be filled on 05/26/24.

## 2024-05-26 ENCOUNTER — Other Ambulatory Visit: Payer: Self-pay

## 2024-05-27 NOTE — Telephone Encounter (Signed)
 Dr. Fleeta Rothman. I found the previous copy. I have it in your box if you would sign it, I will copy your previous one. The previous one will be attached as well. Patient stating due by 06/01/24

## 2024-05-30 NOTE — Telephone Encounter (Signed)
 Ok I will have it in triage.

## 2024-05-31 NOTE — Telephone Encounter (Signed)
 FMLA forms faxed for the patient. Copy placed in triage and batch scan. Patient advised.  Marvin Miller, CMA

## 2024-06-01 ENCOUNTER — Ambulatory Visit: Payer: Self-pay | Admitting: Nurse Practitioner

## 2024-06-03 ENCOUNTER — Ambulatory Visit: Payer: Self-pay | Admitting: Nurse Practitioner

## 2024-06-20 ENCOUNTER — Ambulatory Visit (INDEPENDENT_AMBULATORY_CARE_PROVIDER_SITE_OTHER): Payer: Self-pay | Admitting: Nurse Practitioner

## 2024-06-20 ENCOUNTER — Encounter: Payer: Self-pay | Admitting: Nurse Practitioner

## 2024-06-20 VITALS — BP 135/76 | HR 52 | Wt 298.0 lb

## 2024-06-20 DIAGNOSIS — Z6841 Body Mass Index (BMI) 40.0 and over, adult: Secondary | ICD-10-CM

## 2024-06-20 DIAGNOSIS — R7303 Prediabetes: Secondary | ICD-10-CM

## 2024-06-20 DIAGNOSIS — I10 Essential (primary) hypertension: Secondary | ICD-10-CM

## 2024-06-20 DIAGNOSIS — Z0000 Encounter for general adult medical examination without abnormal findings: Secondary | ICD-10-CM | POA: Diagnosis not present

## 2024-06-20 DIAGNOSIS — Z113 Encounter for screening for infections with a predominantly sexual mode of transmission: Secondary | ICD-10-CM | POA: Insufficient documentation

## 2024-06-20 DIAGNOSIS — E538 Deficiency of other specified B group vitamins: Secondary | ICD-10-CM | POA: Diagnosis not present

## 2024-06-20 DIAGNOSIS — E785 Hyperlipidemia, unspecified: Secondary | ICD-10-CM | POA: Insufficient documentation

## 2024-06-20 DIAGNOSIS — F1220 Cannabis dependence, uncomplicated: Secondary | ICD-10-CM | POA: Insufficient documentation

## 2024-06-20 DIAGNOSIS — H547 Unspecified visual loss: Secondary | ICD-10-CM

## 2024-06-20 LAB — POCT GLYCOSYLATED HEMOGLOBIN (HGB A1C): Hemoglobin A1C: 5.5 % (ref 4.0–5.6)

## 2024-06-20 NOTE — Assessment & Plan Note (Signed)
 Wt Readings from Last 3 Encounters:  06/20/24 298 lb (135.2 kg)  05/11/24 (!) 302 lb (137 kg)  01/12/24 (!) 301 lb (136.5 kg)   Body mass index is 45.31 kg/m.   BMI 45.31. Discussed risks and emphasized diet and exercise for weight management. Consider referral to weight management clinic. - Encouraged diet and exercise for weight management. - Offered referral to medical weight management clinic.

## 2024-06-20 NOTE — Assessment & Plan Note (Addendum)
 Lab Results  Component Value Date   VITAMINB12 97 (L) 06/03/2023   Previously low B12 levels. Taking multivitamin with B12. Advised on timing to avoid interaction with Biktarvy . - Checked B12 levels. - Advised taking multivitamin in the morning and Biktarvy  later in the day.

## 2024-06-20 NOTE — Assessment & Plan Note (Signed)
 Lab Results  Component Value Date   CHOL 176 05/11/2024   HDL 39 (L) 05/11/2024   LDLCALC 124 (H) 05/11/2024   TRIG 44 05/11/2024   CHOLHDL 4.5 05/11/2024

## 2024-06-20 NOTE — Assessment & Plan Note (Signed)
 Cessation encouraged

## 2024-06-20 NOTE — Assessment & Plan Note (Signed)
 Lab Results  Component Value Date   HGBA1C 5.5 06/20/2024

## 2024-06-20 NOTE — Patient Instructions (Addendum)
 Around 3 times per week, check your blood pressure 2 times per day. once in the morning and once in the evening. The readings should be at least one minute apart. Write down these values and bring them to your next nurse visit/appointment.  When you check your BP, make sure you have been doing something calm/relaxing 5 minutes prior to checking. Both feet should be flat on the floor and you should be sitting. Use your left arm and make sure it is in a relaxed position (on a table), and that the cuff is at the approximate level/height of your heart.      . Screen for STD (sexually transmitted disease)  - RPR - Chlamydia/Gonococcus/Trichomonas, NAA  It is important that you exercise regularly at least 30 minutes 5 times a week as tolerated  Think about what you will eat, plan ahead. Choose  clean, green, fresh or frozen over canned, processed or packaged foods which are more sugary, salty and fatty. 70 to 75% of food eaten should be vegetables and fruit. Three meals at set times with snacks allowed between meals, but they must be fruit or vegetables. Aim to eat over a 12 hour period , example 7 am to 7 pm, and STOP after  your last meal of the day. Drink water,generally about 64 ounces per day, no other drink is as healthy. Fruit juice is best enjoyed in a healthy way, by EATING the fruit.  Thanks for choosing [facility] we consider it a privelige to serve you.

## 2024-06-20 NOTE — Assessment & Plan Note (Signed)
 DASH diet and commitment to daily physical activity for a minimum of 30 minutes discussed and encouraged, as a part of hypertension management. The importance of attaining a healthy weight is also discussed. Blood pressure goal is less than 140/90 Follow-up in 2 months for blood pressure check     06/20/2024   11:20 AM 06/20/2024   10:35 AM 06/20/2024   10:27 AM 05/11/2024   11:05 AM 01/12/2024   11:24 AM 11/23/2023    1:29 PM 11/23/2023    1:26 PM  BP/Weight  Systolic BP 135 130 138 152 146 144 173  Diastolic BP 76 64 79 90 90 84 889  Wt. (Lbs)   298 302 301  312  BMI   45.31 kg/m2 45.92 kg/m2 45.77 kg/m2  47.44 kg/m2

## 2024-06-20 NOTE — Assessment & Plan Note (Signed)
 Reports worsening blurry vision. Previous ophthalmologist referral not followed up. - Provided referral to ophthalmologist.

## 2024-06-20 NOTE — Assessment & Plan Note (Signed)
  Annual exam completed. Slightly elevated blood pressure and cholesterol. Continues Biktarvy  and albuterol . No new family history changes. - Encouraged 30 minutes of moderate to vigorous exercise, 5 days a week. - Advised heart-healthy, low salt, low fat diet with 70-80% vegetables and protein, less carbohydrates. - Recommended 64 ounces of water daily. - Advised 7-8 hours of sleep nightly. - Encouraged seatbelt use and safe firearm storage. - Provided home blood pressure monitoring instructions. - Scheduled follow-up in 2 months for blood pressure recheck.  Elevated blood pressure Blood pressure 135/76. Discussed risks of untreated hypertension. Prefers lifestyle modifications. - Provided home blood pressure monitoring instructions. - Scheduled follow-up in 2 months for blood pressure recheck.

## 2024-06-20 NOTE — Assessment & Plan Note (Signed)
 Screening for sexually transmitted infections Requests regular STD panel testing. Recent tests normal except reactive RPR. - Ordered STD panel testing.

## 2024-06-20 NOTE — Progress Notes (Addendum)
 Complete physical exam  Patient: Marvin Miller   DOB: [birth year]   28 y.o. Male  MRN: [id number]  Subjective:    Chief Complaint  Patient presents with   Annual Exam    fasting     Discussed the use of AI scribe software for clinical note transcription with the patient, who gave verbal consent to proceed.  History of Present Illness Marvin Miller is a 28 year old male   has a past medical history of AIDS (acquired immune deficiency syndrome) (HCC), Asthma, Elevated BP without diagnosis of hypertension (10/11/2023), Obesity, PCP (pneumocystis carinii pneumonia) (HCC), Prediabetes, Sleep apnea, and Vaccine counseling (04/15/2023). who presents for an annual physical exam.  He has been experiencing increased fatigue despite adequate sleep, which he attributes to the time change. He occasionally consumes alcohol, typically two to three drinks socially, and uses marijuana occasionally. He does not use tobacco or vape. He lives alone and is sexually active, using condoms consistently. He requests STD testing despite having no symptoms.  He is concerned about his vitamin B12 levels, as they were low last year, and requests to have them checked again. He is currently taking a multivitamin that includes vitamin B12.  He has a history of high blood pressure readings during medical visits, which he attributes to anxiety. He has been monitoring his blood pressure at home, especially when relaxed, but acknowledges the need to check it more frequently. High blood pressure runs in his family.  He is currently taking Biktarvy  daily for HIV management and uses an albuterol  inhaler as needed. He follows up with an infectious disease specialist and is adherent to his medication regimen.  He mentions experiencing worsening blurry vision and requests a referral to an ophthalmologist.     Assessment & Plan      Most recent fall risk assessment:    05/11/2024   11:06 AM  Fall Risk   Falls in  the past year? 1  Number falls in past yr: 1  Injury with Fall? 0     Most recent depression screenings:    06/20/2024   10:29 AM 11/23/2023    1:27 PM  PHQ 2/9 Scores  PHQ - 2 Score 0 0  PHQ- 9 Score 0         Patient Care Team: Satonya Lux R, FNP as PCP - General (Nurse Practitioner)   Outpatient Medications Prior to Visit  Medication Sig   albuterol  (VENTOLIN  HFA) 108 (90 Base) MCG/ACT inhaler Inhale 2 puffs into the lungs every 6 (six) hours as needed for wheezing or shortness of breath.   bictegravir-emtricitabine -tenofovir  AF (BIKTARVY ) 50-200-25 MG TABS tablet Take 1 tablet by mouth daily.   Multiple Vitamin (MULTIVITAMIN) tablet Take 1 tablet by mouth daily.   No facility-administered medications prior to visit.    Review of Systems  Constitutional:  Negative for appetite change, chills, fatigue and fever.  HENT:  Negative for congestion, postnasal drip, rhinorrhea and sneezing.   Eyes:  Positive for visual disturbance. Negative for pain, discharge and itching.  Respiratory:  Negative for cough, shortness of breath and wheezing.   Cardiovascular:  Negative for chest pain, palpitations and leg swelling.  Gastrointestinal:  Negative for abdominal pain, constipation, nausea and vomiting.  Endocrine: Negative for cold intolerance, heat intolerance and polydipsia.  Genitourinary:  Negative for difficulty urinating, dysuria, flank pain and frequency.  Musculoskeletal:  Negative for arthralgias, back pain, joint swelling and myalgias.  Skin:  Negative for color change, pallor, rash  and wound.  Allergic/Immunologic: Positive for immunocompromised state. Negative for food allergies.  Neurological:  Negative for dizziness, facial asymmetry, weakness, numbness and headaches.  Psychiatric/Behavioral:  Negative for behavioral problems, confusion, self-injury and suicidal ideas.        Objective:     BP 135/76   Pulse (!) 52   Wt 298 lb (135.2 kg)   SpO2 94%    BMI 45.31 kg/m    Physical Exam Vitals and nursing note reviewed.  Constitutional:      General: He is not in acute distress.    Appearance: Normal appearance. He is obese. He is not ill-appearing, toxic-appearing or diaphoretic.  HENT:     Right Ear: Tympanic membrane, ear canal and external ear normal. There is no impacted cerumen.     Left Ear: Tympanic membrane, ear canal and external ear normal. There is no impacted cerumen.     Nose: Nose normal. No congestion or rhinorrhea.     Mouth/Throat:     Mouth: Mucous membranes are moist.     Pharynx: Oropharynx is clear. No oropharyngeal exudate or posterior oropharyngeal erythema.  Eyes:     General: No scleral icterus.       Right eye: No discharge.        Left eye: No discharge.     Extraocular Movements: Extraocular movements intact.     Conjunctiva/sclera: Conjunctivae normal.  Neck:     Vascular: No carotid bruit.  Cardiovascular:     Rate and Rhythm: Normal rate and regular rhythm.     Pulses: Normal pulses.     Heart sounds: Normal heart sounds. No murmur heard.    No friction rub. No gallop.  Pulmonary:     Effort: Pulmonary effort is normal. No respiratory distress.     Breath sounds: Normal breath sounds. No stridor. No wheezing, rhonchi or rales.  Chest:     Chest wall: No tenderness.  Abdominal:     General: Bowel sounds are normal. There is no distension.     Palpations: Abdomen is soft. There is no mass.     Tenderness: There is no abdominal tenderness. There is no right CVA tenderness, left CVA tenderness, guarding or rebound.     Hernia: No hernia is present.  Musculoskeletal:        General: No swelling, tenderness, deformity or signs of injury.     Cervical back: Normal range of motion and neck supple. No rigidity or tenderness.     Right lower leg: No edema.     Left lower leg: No edema.  Lymphadenopathy:     Cervical: No cervical adenopathy.  Skin:    General: Skin is warm and dry.     Capillary  Refill: Capillary refill takes 2 to 3 seconds.     Coloration: Skin is not jaundiced or pale.     Findings: No bruising, erythema, lesion or rash.  Neurological:     Mental Status: He is alert and oriented to person, place, and time.     Cranial Nerves: No cranial nerve deficit.     Sensory: No sensory deficit.     Motor: No weakness.     Coordination: Coordination normal.     Gait: Gait normal.     Deep Tendon Reflexes: Reflexes normal.  Psychiatric:        Mood and Affect: Mood normal.        Behavior: Behavior normal.        Thought Content: Thought content normal.  Judgment: Judgment normal.     Results for orders placed or performed in visit on 06/20/24  POCT glycosylated hemoglobin (Hb A1C)  Result Value Ref Range   Hemoglobin A1C 5.5 4.0 - 5.6 %   HbA1c POC (<> result, manual entry)     HbA1c, POC (prediabetic range)     HbA1c, POC (controlled diabetic range)         Assessment & Plan:    Routine Health Maintenance and Physical Exam  Immunization History  Administered Date(s) Administered   DTaP 02/16/1996, 09/15/1997, 05/07/1998, 11/19/1998, 04/07/2001   HIB, Unspecified 02/16/1996, 09/15/1997   HPV 9-valent 05/04/2009, 11/09/2009, 06/24/2010   Hepatitis A, Ped/Adol-2 Dose 04/04/2008, 05/04/2009   Hepatitis B, PED/ADOLESCENT 1996-08-08, 12/16/1995, 09/15/1997   Hepb-cpg 05/13/2023, 07/01/2023   Influenza, Seasonal, Injecte, Preservative Fre 04/15/2023, 05/11/2024   Influenza-Unspecified 06/06/2004, 06/01/2007, 05/04/2009, 06/24/2010   MMR 09/15/1997, 04/07/2001   Meningococcal Acwy, Unspecified 04/04/2008   Meningococcal Mcv4o 07/01/2023, 08/26/2023   PNEUMOCOCCAL CONJUGATE-20 04/15/2023   Pfizer(Comirnaty)Fall Seasonal Vaccine 12 years and older 05/13/2023, 05/11/2024   Polio, Unspecified 02/16/1996, 09/15/1997, 05/07/1998, 04/07/2001   Td 06/01/2007   Tdap 06/01/2007, 06/03/2023    Health Maintenance  Topic Date Due   COVID-19 Vaccine (3 - Pfizer  risk series) 06/08/2024   DTaP/Tdap/Td (9 - Td or Tdap) 06/02/2033   Pneumococcal Vaccine  Completed   Influenza Vaccine  Completed   Hepatitis B Vaccines 19-59 Average Risk  Completed   HPV VACCINES  Completed   Hepatitis C Screening  Completed   HIV Screening  Completed   Meningococcal B Vaccine  Aged Out    Discussed health benefits of physical activity, and encouraged him to engage in regular exercise appropriate for his age and condition.  Problem List Items Addressed This Visit       Cardiovascular and Mediastinum   Hypertension   DASH diet and commitment to daily physical activity for a minimum of 30 minutes discussed and encouraged, as a part of hypertension management. The importance of attaining a healthy weight is also discussed. Blood pressure goal is less than 140/90 Follow-up in 2 months for blood pressure check     06/20/2024   11:20 AM 06/20/2024   10:35 AM 06/20/2024   10:27 AM 05/11/2024   11:05 AM 01/12/2024   11:24 AM 11/23/2023    1:29 PM 11/23/2023    1:26 PM  BP/Weight  Systolic BP 135 130 138 152 146 144 173  Diastolic BP 76 64 79 90 90 84 889  Wt. (Lbs)   298 302 301  312  BMI   45.31 kg/m2 45.92 kg/m2 45.77 kg/m2  47.44 kg/m2             Other   Morbid obesity with BMI of 45.0-49.9, adult (HCC)   Wt Readings from Last 3 Encounters:  06/20/24 298 lb (135.2 kg)  05/11/24 (!) 302 lb (137 kg)  01/12/24 (!) 301 lb (136.5 kg)   Body mass index is 45.31 kg/m.   BMI 45.31. Discussed risks and emphasized diet and exercise for weight management. Consider referral to weight management clinic. - Encouraged diet and exercise for weight management. - Offered referral to medical weight management clinic.       Prediabetes   Lab Results  Component Value Date   HGBA1C 5.5 06/20/2024         Relevant Orders   POCT glycosylated hemoglobin (Hb A1C) (Completed)   Hemoglobin A1c   Annual physical exam - Primary  Annual exam completed. Slightly  elevated blood pressure and cholesterol. Continues Biktarvy  and albuterol . No new family history changes. - Encouraged 30 minutes of moderate to vigorous exercise, 5 days a week. - Advised heart-healthy, low salt, low fat diet with 70-80% vegetables and protein, less carbohydrates. - Recommended 64 ounces of water daily. - Advised 7-8 hours of sleep nightly. - Encouraged seatbelt use and safe firearm storage. - Provided home blood pressure monitoring instructions. - Scheduled follow-up in 2 months for blood pressure recheck.  Elevated blood pressure Blood pressure 135/76. Discussed risks of untreated hypertension. Prefers lifestyle modifications. - Provided home blood pressure monitoring instructions. - Scheduled follow-up in 2 months for blood pressure recheck.      Impaired vision   Reports worsening blurry vision. Previous ophthalmologist referral not followed up. - Provided referral to ophthalmologist.       Relevant Orders   Ambulatory referral to Ophthalmology   Hyperlipidemia   Lab Results  Component Value Date   CHOL 176 05/11/2024   HDL 39 (L) 05/11/2024   LDLCALC 124 (H) 05/11/2024   TRIG 44 05/11/2024   CHOLHDL 4.5 05/11/2024         Vitamin B12 deficiency   Lab Results  Component Value Date   VITAMINB12 97 (L) 06/03/2023   Previously low B12 levels. Taking multivitamin with B12. Advised on timing to avoid interaction with Biktarvy . - Checked B12 levels. - Advised taking multivitamin in the morning and Biktarvy  later in the day.       Relevant Orders   Vitamin B12   Screen for STD (sexually transmitted disease)   Screening for sexually transmitted infections Requests regular STD panel testing. Recent tests normal except reactive RPR. - Ordered STD panel testing.      Relevant Orders   RPR   Chlamydia/Gonococcus/Trichomonas, NAA   Hepatitis C antibody   Hepatitis B surface antigen   Cannabis dependence (HCC)   Cessation encouraged      Return  in about 2 months (around 08/20/2024) for HTN.     Estefania Kamiya R Nichol Ator, FNP

## 2024-06-21 ENCOUNTER — Ambulatory Visit: Payer: Self-pay | Admitting: Nurse Practitioner

## 2024-06-21 ENCOUNTER — Other Ambulatory Visit: Payer: Self-pay

## 2024-06-21 LAB — HEPATITIS B SURFACE ANTIGEN: Hepatitis B Surface Ag: NEGATIVE

## 2024-06-21 LAB — CHLAMYDIA/GONOCOCCUS/TRICHOMONAS, NAA
Chlamydia by NAA: NEGATIVE
Gonococcus by NAA: NEGATIVE
Trich vag by NAA: NEGATIVE

## 2024-06-21 LAB — VITAMIN B12: Vitamin B-12: 169 pg/mL — ABNORMAL LOW (ref 232–1245)

## 2024-06-21 LAB — HEPATITIS C ANTIBODY: Hep C Virus Ab: NONREACTIVE

## 2024-06-21 LAB — RPR: RPR Ser Ql: NONREACTIVE

## 2024-06-21 LAB — HEMOGLOBIN A1C
Est. average glucose Bld gHb Est-mCnc: 111 mg/dL
Hgb A1c MFr Bld: 5.5 % (ref 4.8–5.6)

## 2024-06-23 ENCOUNTER — Other Ambulatory Visit: Payer: Self-pay

## 2024-06-27 ENCOUNTER — Other Ambulatory Visit: Payer: Self-pay

## 2024-06-27 ENCOUNTER — Other Ambulatory Visit (HOSPITAL_COMMUNITY): Payer: Self-pay

## 2024-06-27 NOTE — Progress Notes (Signed)
 Specialty Pharmacy Refill Coordination Note  Aqib Lough Rack is a 28 y.o. male contacted today regarding refills of specialty medication(s) Bictegravir-Emtricitab-Tenofov (Biktarvy )   Patient requested Delivery   Delivery date: 07/01/24   Verified address: [address] [place] [address]   Medication will be filled on: 06/30/24

## 2024-06-29 ENCOUNTER — Other Ambulatory Visit: Payer: Self-pay

## 2024-07-15 ENCOUNTER — Other Ambulatory Visit: Payer: Self-pay

## 2024-07-22 ENCOUNTER — Other Ambulatory Visit: Payer: Self-pay

## 2024-07-26 ENCOUNTER — Other Ambulatory Visit: Payer: Self-pay

## 2024-07-26 NOTE — Progress Notes (Signed)
 Specialty Pharmacy Refill Coordination Note  Marvin Miller is a 28 y.o. male contacted today regarding refills of specialty medication(s) Bictegravir-Emtricitab-Tenofov (Biktarvy )   Patient requested Delivery   Delivery date: 07/29/24   Verified address: [address] [place] [address]   Medication will be filled on: 07/28/24

## 2024-08-01 ENCOUNTER — Encounter: Admitting: Nurse Practitioner

## 2024-08-18 ENCOUNTER — Other Ambulatory Visit: Payer: Self-pay | Admitting: Pharmacy Technician

## 2024-08-18 ENCOUNTER — Other Ambulatory Visit (HOSPITAL_COMMUNITY): Payer: Self-pay

## 2024-08-18 ENCOUNTER — Other Ambulatory Visit: Payer: Self-pay

## 2024-08-18 NOTE — Progress Notes (Signed)
 Specialty Pharmacy Refill Coordination Note  Marvin Miller is a 29 y.o. male contacted today regarding refills of specialty medication(s) Bictegravir-Emtricitab-Tenofov (Biktarvy )   Patient requested Delivery   Delivery date: 08/24/24   Verified address: [address] [facility] [place]   Medication will be filled on: 08/23/24

## 2024-08-22 ENCOUNTER — Other Ambulatory Visit: Payer: Self-pay

## 2024-08-22 NOTE — Progress Notes (Signed)
 Clinical Intervention Note  Clinical Intervention Notes: Patient reported taking amoxicillin. No DDIs identified with Biktarvy .   Clinical Intervention Outcomes: Prevention of an adverse drug event   Advertising Account Planner

## 2024-08-23 ENCOUNTER — Other Ambulatory Visit: Payer: Self-pay

## 2024-08-26 ENCOUNTER — Ambulatory Visit: Payer: Self-pay | Admitting: Nurse Practitioner

## 2024-09-05 ENCOUNTER — Other Ambulatory Visit (HOSPITAL_COMMUNITY): Payer: Self-pay

## 2024-09-05 ENCOUNTER — Encounter: Payer: Self-pay | Admitting: Infectious Disease

## 2024-09-15 ENCOUNTER — Other Ambulatory Visit: Payer: Self-pay

## 2024-11-09 ENCOUNTER — Ambulatory Visit: Admitting: Infectious Disease
